# Patient Record
Sex: Female | Born: 1980 | Race: Black or African American | Hispanic: No | Marital: Single | Smoking: Never smoker
Health system: Southern US, Community
[De-identification: ages and names within clinical notes are randomized; demographics above are authoritative.]

## PROBLEM LIST (undated history)

## (undated) DIAGNOSIS — E039 Hypothyroidism, unspecified: Secondary | ICD-10-CM

## (undated) DIAGNOSIS — F32A Depression, unspecified: Secondary | ICD-10-CM

## (undated) DIAGNOSIS — F513 Sleepwalking [somnambulism]: Secondary | ICD-10-CM

## (undated) DIAGNOSIS — J302 Other seasonal allergic rhinitis: Secondary | ICD-10-CM

## (undated) DIAGNOSIS — K219 Gastro-esophageal reflux disease without esophagitis: Secondary | ICD-10-CM

## (undated) DIAGNOSIS — F329 Major depressive disorder, single episode, unspecified: Secondary | ICD-10-CM

## (undated) DIAGNOSIS — E119 Type 2 diabetes mellitus without complications: Secondary | ICD-10-CM

## (undated) DIAGNOSIS — G43909 Migraine, unspecified, not intractable, without status migrainosus: Secondary | ICD-10-CM

## (undated) DIAGNOSIS — T753XXA Motion sickness, initial encounter: Secondary | ICD-10-CM

## (undated) DIAGNOSIS — D259 Leiomyoma of uterus, unspecified: Secondary | ICD-10-CM

---

## 2005-09-20 ENCOUNTER — Other Ambulatory Visit: Admission: RE | Admit: 2005-09-20 | Discharge: 2005-09-20 | Payer: Self-pay | Admitting: Obstetrics and Gynecology

## 2006-04-02 ENCOUNTER — Emergency Department (HOSPITAL_COMMUNITY): Admission: EM | Admit: 2006-04-02 | Discharge: 2006-04-02 | Payer: Self-pay | Admitting: Emergency Medicine

## 2007-10-02 ENCOUNTER — Emergency Department (HOSPITAL_COMMUNITY): Admission: EM | Admit: 2007-10-02 | Discharge: 2007-10-02 | Payer: Self-pay | Admitting: Family Medicine

## 2007-11-01 ENCOUNTER — Encounter: Admission: RE | Admit: 2007-11-01 | Discharge: 2007-11-01 | Payer: Self-pay | Admitting: Internal Medicine

## 2009-07-02 IMAGING — US US ABDOMEN COMPLETE
1 series · 14 of 25 positions shown · non-contrast
Comparison: None.

CLINICAL DATA: GERD, vomiting. Goiter.
 ABDOMEN ULTRASOUND:
TECHNIQUE: Complete abdominal ultrasound examination was performed including evaluation of the liver, gallbladder, bile ducts, pancreas, kidneys, spleen, IVC, and abdominal aorta.

[Series 1: us abdomen complete · 0.26mm/px · 14 of 68 slices shown]
[im 1/68]
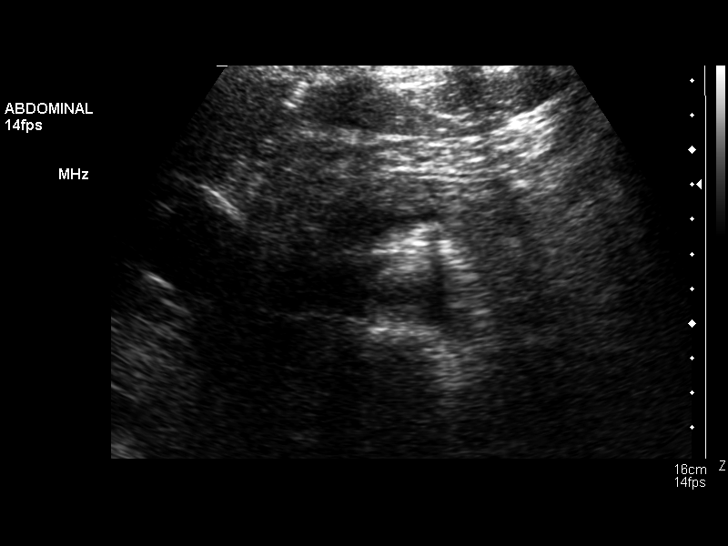
[im 6/68]
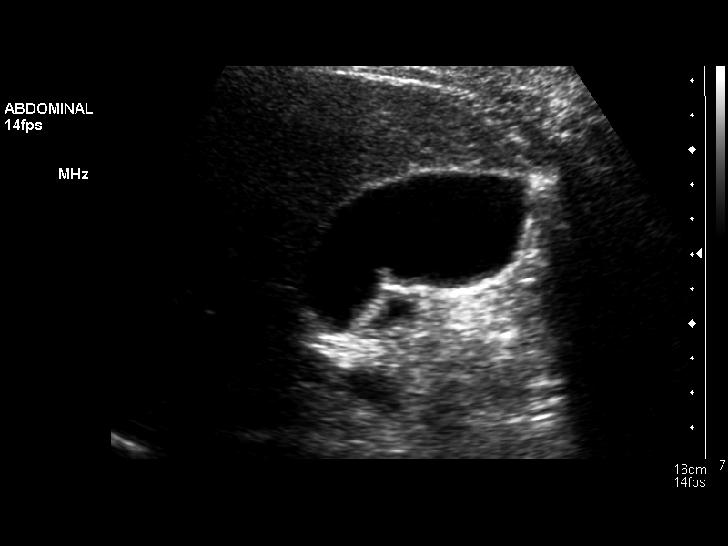
[im 12/68]
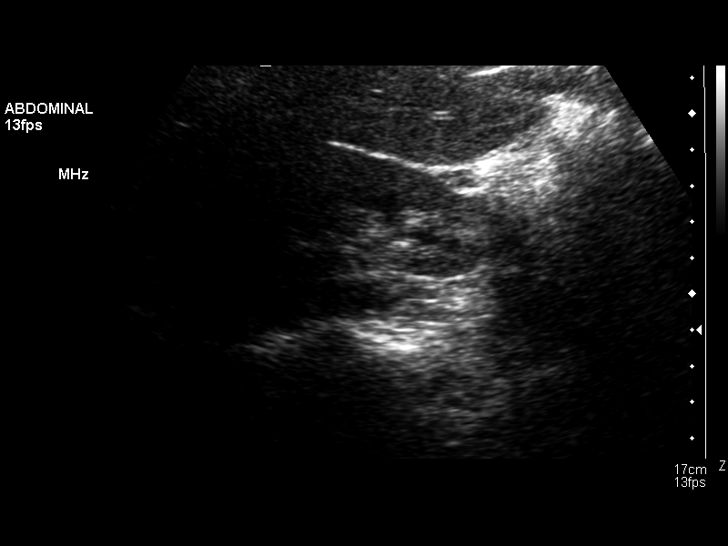
[im 17/68]
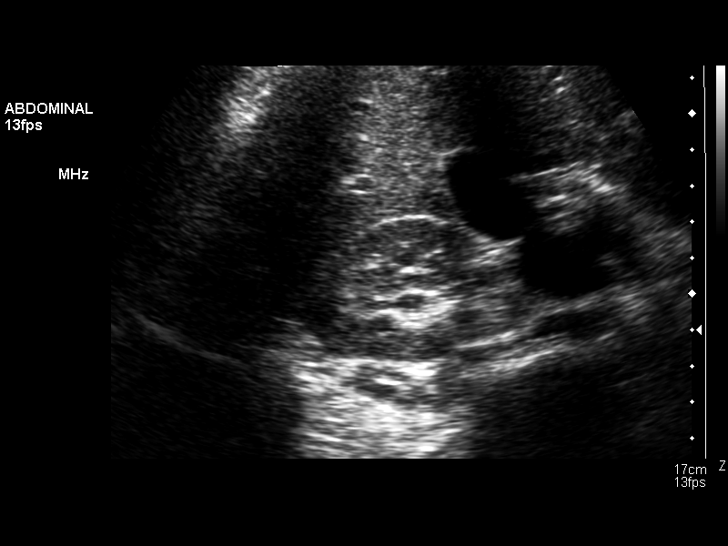
[im 23/68]
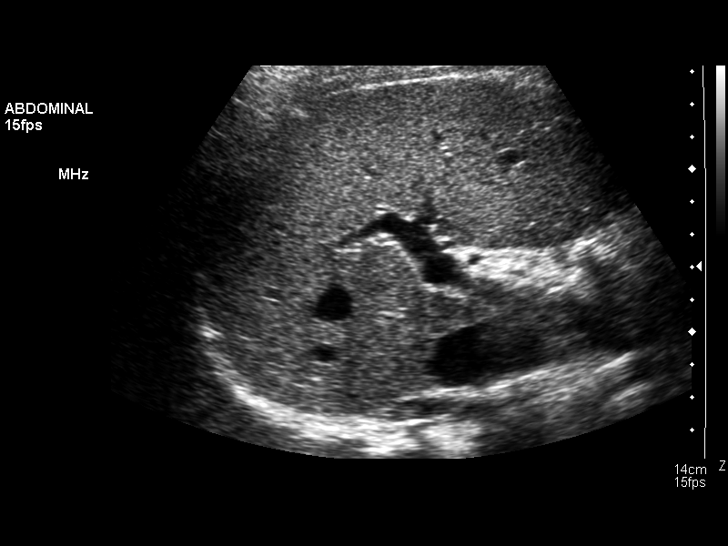
[im 26/68]
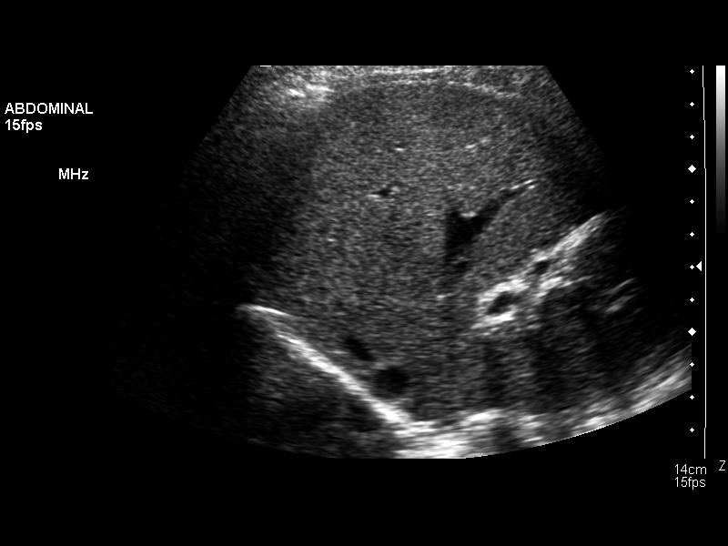
[im 31/68]
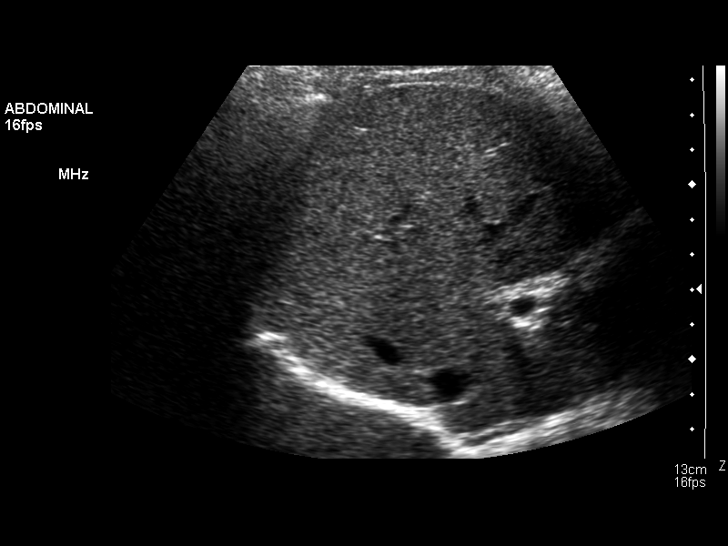
[im 37/68]
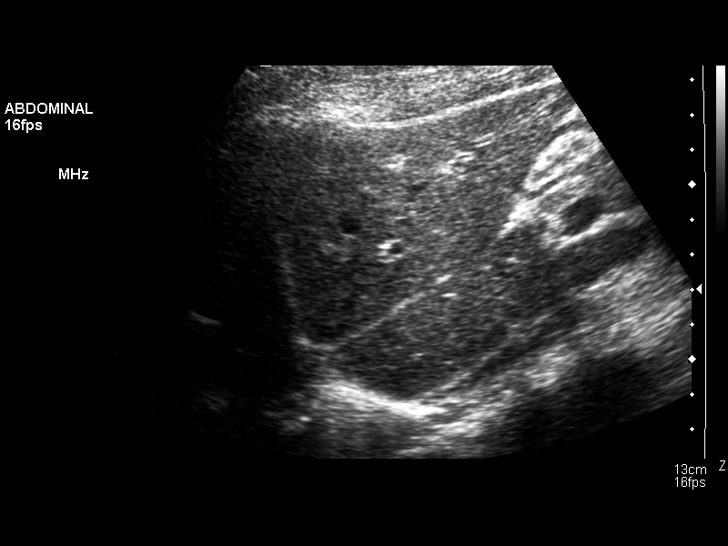
[im 42/68]
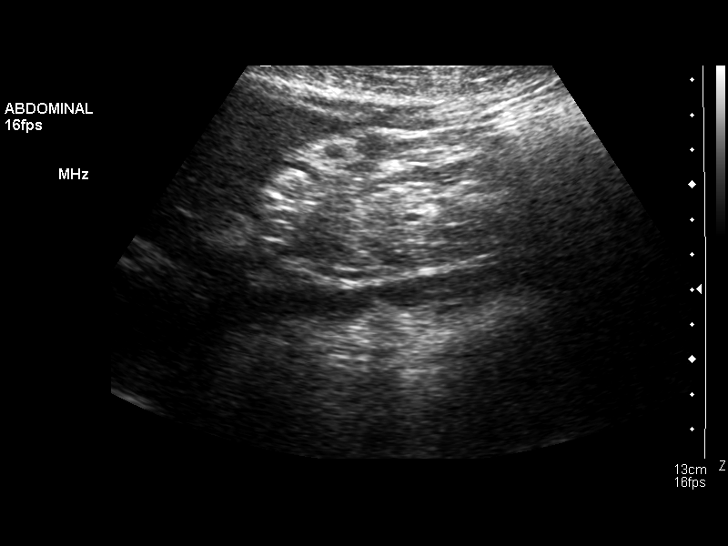
[im 45/68]
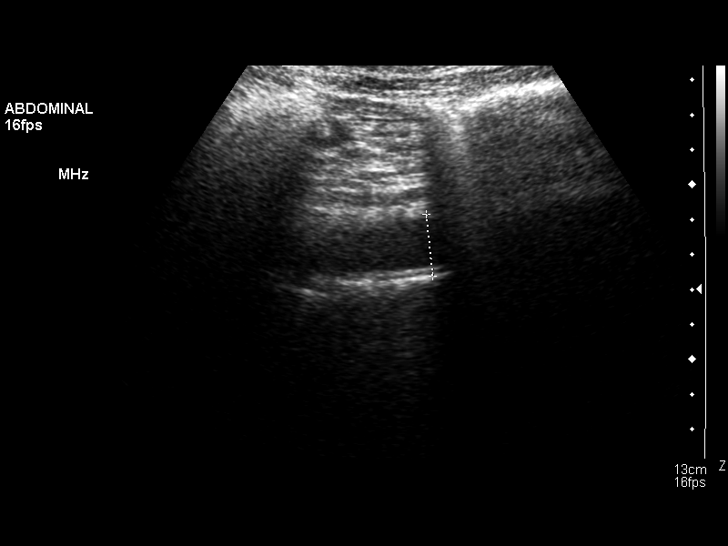
[im 51/68]
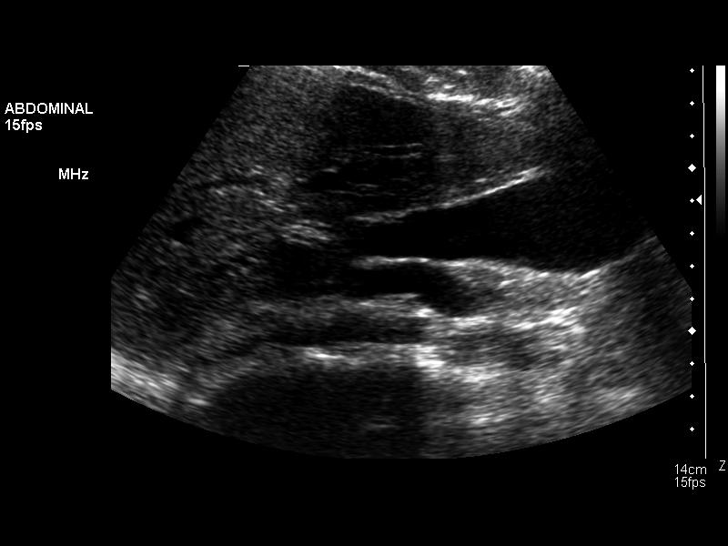
[im 56/68]
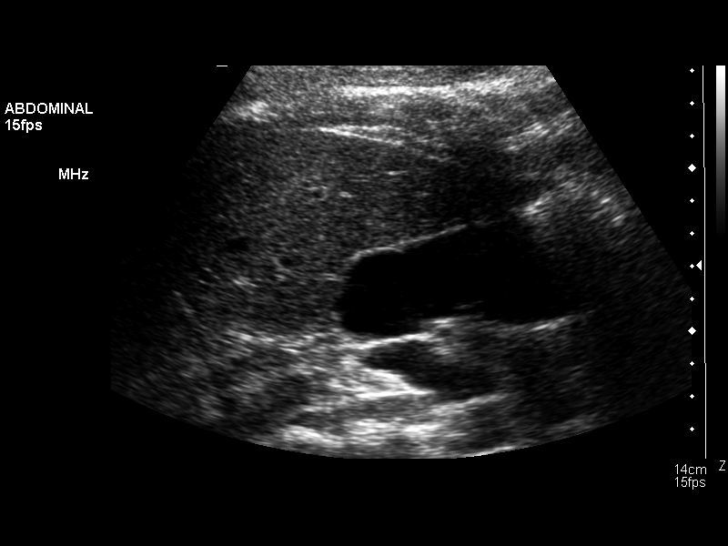
[im 62/68]
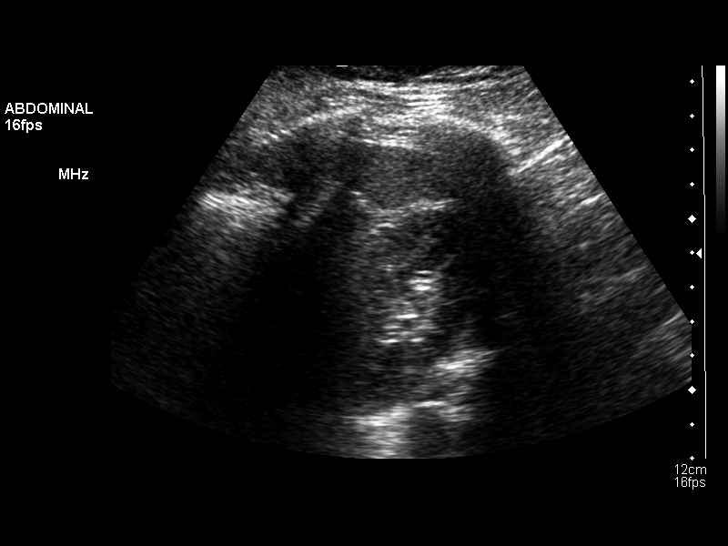
[im 68/68]
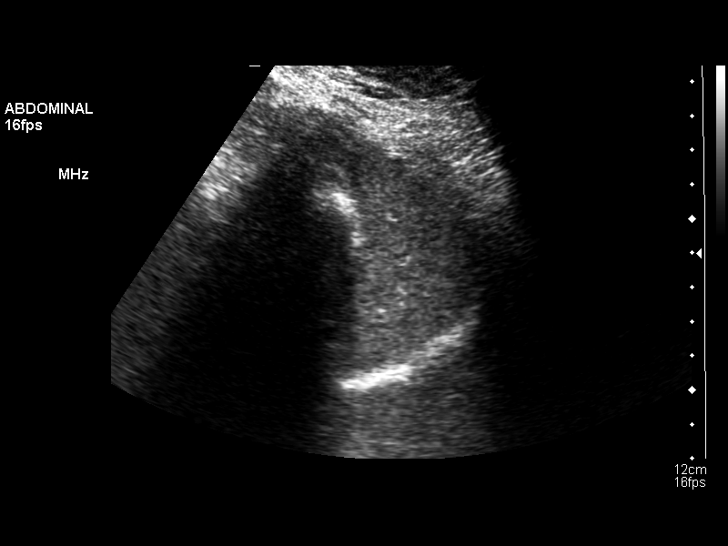

[14 of 25 positions shown; findings below may reference images not displayed]

FINDINGS: There is no evidence of gallstones or biliary ductal dilatation.  Gallbladder wall thickness is 3 mm.  Common bile duct diameter is 4 mm.  The liver is within normal limits in echogenicity, and no focal liver lesions are seen.  The visualized portions of the IVC and pancreas are unremarkable.  There is no evidence of splenomegaly with the spleen measuring 8.5 cm long.  The kidneys are unremarkable, and there is no evidence of hydronephrosis.  The right kidney measures 11.6 cm long with the left measuring 11 cm long.  The abdominal aorta is nondilated with maximum diameter of 2.2 cm.
IMPRESSION: Negative abdominal ultrasound.

## 2011-08-31 ENCOUNTER — Ambulatory Visit (HOSPITAL_BASED_OUTPATIENT_CLINIC_OR_DEPARTMENT_OTHER): Payer: BC Managed Care – PPO | Attending: Family Medicine

## 2011-08-31 DIAGNOSIS — G4730 Sleep apnea, unspecified: Secondary | ICD-10-CM | POA: Insufficient documentation

## 2011-08-31 DIAGNOSIS — G4710 Hypersomnia, unspecified: Secondary | ICD-10-CM | POA: Insufficient documentation

## 2011-08-31 DIAGNOSIS — R0609 Other forms of dyspnea: Secondary | ICD-10-CM | POA: Insufficient documentation

## 2011-08-31 DIAGNOSIS — R0989 Other specified symptoms and signs involving the circulatory and respiratory systems: Secondary | ICD-10-CM | POA: Insufficient documentation

## 2011-09-02 DIAGNOSIS — G4710 Hypersomnia, unspecified: Secondary | ICD-10-CM

## 2011-09-02 DIAGNOSIS — G4730 Sleep apnea, unspecified: Secondary | ICD-10-CM

## 2011-09-02 DIAGNOSIS — R0609 Other forms of dyspnea: Secondary | ICD-10-CM

## 2011-09-02 DIAGNOSIS — R0989 Other specified symptoms and signs involving the circulatory and respiratory systems: Secondary | ICD-10-CM

## 2011-09-02 NOTE — Procedures (Signed)
[name], Kathy Molina               ACCOUNT NO.:  [id number]  MEDICAL RECORD NO.:  [id number]          PATIENT TYPE:  OUT  LOCATION:  SLEEP CENTER                 FACILITY:  [facility]  PHYSICIAN:  Clinton D. Maple Hudson, MD, FCCP, FACPDATE OF BIRTH:  1980/04/04  DATE OF STUDY:  08/31/2011                           NOCTURNAL POLYSOMNOGRAM  REFERRING PHYSICIAN:  Paulino Rily  REFERRING DOCTOR:  Knox Royalty, MD  INDICATION FOR STUDY:  Hypersomnia with sleep apnea.  EPWORTH SLEEPINESS SCORE:  Epworth sleepiness score 14/24.  BMI 31.5, weight 207 pounds, height 68 inches, neck 15 inches.  MEDICATIONS:  Home medications are charted and reviewed.  SLEEP ARCHITECTURE:  Total sleep time 344 minutes with sleep efficiency 87.2%.  Stage I was 2.8%, stage II 74.9%, stage III absent, REM 22.4% of total sleep time.  Sleep latency 37 minutes, REM latency 129.5 minutes, awake after sleep onset 12.5 minutes, arousal index 15.5.  BEDTIME MEDICATION:  Ibuprofen.  RESPIRATORY DATA:  Apnea-hypopnea index (AHI) 2.6 per hour.  A total of 15 events were scored including 4 obstructive apneas, 3 central apneas, 8 hypopneas.  Most events were associated with supine sleep position. REM AHI 10.9 per hour.  There were insufficient numbers of events to qualify for application of CPAP titration by split protocol on this study night.  OXYGEN DATA:  Mild-to-moderate snoring with oxygen desaturation to a nadir of 86% and a mean oxygen saturation through the study of 96.4% on room air.  CARDIAC DATA:  Normal sinus rhythm.  MOVEMENT/PARASOMNIA:  A few limb jerks were noted with insignificant effect on sleep.  No sleep talking or sleep walking noted during the study, although reported from home.  Bathroom x2.  IMPRESSION/RECOMMENDATION: 1. Unremarkable sleep architecture for Sleep Center environment.  No     sleep talking or sleep walking noted during the study. 2. Occasional respiratory events with sleep  disturbance, within normal     limits.  Apnea/hypopnea index 2.6 per     hour (the normal range for adults is from 0-5 episodes per hour).     Mild-to-moderate snoring with oxygen desaturation to a nadir of 86%     and a mean oxygen saturation through the study of 96.4% on room     air.     Clinton D. Maple Hudson, MD, [facility], FACP Diplomate, Biomedical engineer of Sleep Medicine Electronically Signed    CDY/MEDQ  D:  09/02/2011 15:25:31  T:  09/02/2011 15:36:29  Job:  [id number]

## 2012-10-17 ENCOUNTER — Encounter (HOSPITAL_BASED_OUTPATIENT_CLINIC_OR_DEPARTMENT_OTHER): Payer: Self-pay | Admitting: *Deleted

## 2012-10-17 ENCOUNTER — Emergency Department (HOSPITAL_BASED_OUTPATIENT_CLINIC_OR_DEPARTMENT_OTHER)
Admission: EM | Admit: 2012-10-17 | Discharge: 2012-10-17 | Disposition: A | Discharge status: Home / Self Care | Payer: BC Managed Care – PPO | Attending: Emergency Medicine | Admitting: Emergency Medicine

## 2012-10-17 DIAGNOSIS — Z79899 Other long term (current) drug therapy: Secondary | ICD-10-CM | POA: Insufficient documentation

## 2012-10-17 DIAGNOSIS — F3289 Other specified depressive episodes: Secondary | ICD-10-CM | POA: Insufficient documentation

## 2012-10-17 DIAGNOSIS — F419 Anxiety disorder, unspecified: Secondary | ICD-10-CM

## 2012-10-17 DIAGNOSIS — F329 Major depressive disorder, single episode, unspecified: Secondary | ICD-10-CM

## 2012-10-17 DIAGNOSIS — G4700 Insomnia, unspecified: Secondary | ICD-10-CM

## 2012-10-17 DIAGNOSIS — Z87898 Personal history of other specified conditions: Secondary | ICD-10-CM | POA: Insufficient documentation

## 2012-10-17 DIAGNOSIS — F411 Generalized anxiety disorder: Secondary | ICD-10-CM | POA: Insufficient documentation

## 2012-10-17 DIAGNOSIS — Z8669 Personal history of other diseases of the nervous system and sense organs: Secondary | ICD-10-CM | POA: Insufficient documentation

## 2012-10-17 HISTORY — DX: Migraine, unspecified, not intractable, without status migrainosus: G43.909

## 2012-10-17 HISTORY — DX: Leiomyoma of uterus, unspecified: D25.9

## 2012-10-17 HISTORY — DX: Depression, unspecified: F32.A

## 2012-10-17 HISTORY — DX: Major depressive disorder, single episode, unspecified: F32.9

## 2012-10-17 MED ORDER — LORAZEPAM 1 MG PO TABS: 1.0000 mg | ORAL_TABLET | Freq: Three times a day (TID) | ORAL | Status: DC | PRN

## 2012-10-17 NOTE — ED Notes (Signed)
Patient states she has a long history of intermittent depression, normally is able to bring herself out of the depression.  States last Saturday 10/12/12 she woke up crying and has not been able to shake the depression, is having insomnia and decreased appetite.  States she has an appointment with the [facility] next week, but feels she can not wait till next week with no sleep or with the feelings of depression.  Denies being suicidal or homicidal or having a plan to do anything to her self or anyone.  States she has uncontrolled crying and has missed work this week and is sleeping last 3-4 hours per night.

## 2012-10-17 NOTE — ED Provider Notes (Signed)
History     CSN: [id number]  Arrival date & time 10/17/12  1322   First MD Initiated Contact with Patient 10/17/12 1329      Chief Complaint  Patient presents with  . Depression    (Consider location/radiation/quality/duration/timing/severity/associated sxs/prior treatment) HPI Comments: This is a 31 year old female, who presents to the ED with a chief complaint of depression and insomnia.  She states that her symptoms have been present for almost week.  She reports crying uncontrollably and only being able to sleep 3-4 hours per night.  She has a history of depression, for which she was taking Zoloft, but she discontinued this several years ago.  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  She also says that she recently broke up with her boyfriend, and that her parents are pressuring her to get married and start a family.  She has not taken any medications to alleviate her symptoms.  Her symptoms have been constant over the past week. She endorses insomnia and nausea when she eats.   Additionally, she reports having a migraine yesterday, but she denies any pain, headache, blurred vision, chest pain, SOB, nausea, vomiting, diarrhea, constipation, numbness or tingling of the extremities, or peripheral edema today.  The history is provided by the patient. No language interpreter was used.    Past Medical History  Diagnosis Date  . Depression   . Migraine   . Fibroid uterus     History reviewed. No pertinent past surgical history.  No family history on file.  History  Substance Use Topics  . Smoking status: Never Smoker   . Smokeless tobacco: Not on file  . Alcohol Use: Yes     Comment: occassionally    OB History    Grav Para Term Preterm Abortions TAB SAB Ect Mult Living                  Review of Systems  All other systems reviewed and are negative.    Allergies  Review of patient's  allergies indicates no known allergies.  Home Medications  No current outpatient prescriptions on file.  BP 142/92  Pulse 109  Temp 98.4 F (36.9 C) (Oral)  Resp 20  SpO2 100%  Physical Exam  Nursing note and vitals reviewed. Constitutional: She is oriented to person, place, and time. She appears well-developed and well-nourished.  HENT:  Head: Normocephalic and atraumatic.  Eyes: Conjunctivae normal and EOM are normal. Pupils are equal, round, and reactive to light.  Neck: Normal range of motion. Neck supple.  Cardiovascular: Normal rate and regular rhythm.  Exam reveals no gallop and no friction rub.   No murmur heard. Pulmonary/Chest: Effort normal and breath sounds normal. No respiratory distress. She has no wheezes. She has no rales. She exhibits no tenderness.  Abdominal: Soft. Bowel sounds are normal. She exhibits no distension and no mass. There is no tenderness. There is no rebound and no guarding.  Musculoskeletal: Normal range of motion. She exhibits no edema and no tenderness.  Neurological: She is alert and oriented to person, place, and time.  Skin: Skin is warm and dry.  Psychiatric: Her behavior is normal. Judgment and thought content normal.       Depressed mood.     ED Course  Procedures (including critical care time)  Labs Reviewed - No data to display No results found.   1. Depression   2. Insomnia  3. Anxiety       MDM  This is a 31 year old female with anxiety, depression, and insomnia.  Patient does not have any SI/HI.  Patient feels that if she could get something to help her relax and sleep, then she would be able to make it to her appointment [facility]. I am going to discharge the patient to home with Ativan, and will encourage her to f/u with the [facility], for which she already has an appointment.  I have discussed return precautions, including worsening symptoms and development of SI/HI.  I have encouraged her to  return to the [facility] ED if this becomes the case, as there would be easier access to behavioral health specialists.   The patient understands and agrees with the plan.  I have discussed this patient with Dr. Roselyn Bering, who agrees with my plan.        Roxy Horseman, PA-C 10/17/12 1416

## 2012-10-18 NOTE — ED Provider Notes (Signed)
Medical screening examination/treatment/procedure(s) were performed by non-physician practitioner and as supervising physician I was immediately available for consultation/collaboration.    Jakwan Sally R Esperanza Madrazo, MD 10/18/12 0714 

## 2014-09-03 ENCOUNTER — Ambulatory Visit (HOSPITAL_COMMUNITY): Admit: 2014-09-03 | Payer: BC Managed Care – PPO | Admitting: Obstetrics and Gynecology

## 2014-09-03 ENCOUNTER — Encounter (HOSPITAL_COMMUNITY): Payer: Self-pay

## 2014-09-03 SURGERY — ROBOTIC ASSISTED MYOMECTOMY
Anesthesia: General

## 2014-09-14 ENCOUNTER — Other Ambulatory Visit: Payer: Self-pay | Admitting: Obstetrics and Gynecology

## 2014-09-14 ENCOUNTER — Encounter (HOSPITAL_COMMUNITY): Payer: Self-pay | Admitting: Pharmacist

## 2014-09-17 NOTE — Patient Instructions (Addendum)
   Your procedure is scheduled on:  Tuesday, Nov 3  Enter through the [employer] [facility] at: 6 AM Pick up the phone at the desk and dial [phone] and inform us of your arrival.  Please call this number if you have any problems the morning of surgery: [phone]  Remember: Do not eat or drink after midnight: Monday Take these medicines the morning of surgery with a SIP OF WATER:  Lexapro, zantac  Do not wear jewelry, make-up, or FINGER nail polish No metal in your hair or on your body. Do not wear lotions, powders, perfumes.  You may wear deodorant.  Do not bring valuables to the hospital. Contacts, dentures or bridgework may not be worn into surgery.  Leave suitcase in the car. After Surgery it may be brought to your room. For patients being admitted to the hospital, checkout time is 11:00am the day of discharge.    Patients discharged on the day of surgery will not be allowed to drive home.

## 2014-09-18 ENCOUNTER — Inpatient Hospital Stay (HOSPITAL_COMMUNITY)
Admission: RE | Admit: 2014-09-18 | Discharge: 2014-09-18 | Disposition: A | Discharge status: Home / Self Care | Payer: BC Managed Care – PPO | Source: Ambulatory Visit

## 2014-09-22 ENCOUNTER — Ambulatory Visit (HOSPITAL_COMMUNITY)
Admission: RE | Admit: 2014-09-22 | Payer: BC Managed Care – PPO | Source: Ambulatory Visit | Admitting: Obstetrics and Gynecology

## 2014-09-22 ENCOUNTER — Encounter (HOSPITAL_COMMUNITY): Admission: RE | Payer: Self-pay | Source: Ambulatory Visit

## 2014-09-22 SURGERY — ROBOTIC ASSISTED MYOMECTOMY
Anesthesia: General

## 2015-11-05 ENCOUNTER — Ambulatory Visit (INDEPENDENT_AMBULATORY_CARE_PROVIDER_SITE_OTHER): Payer: Self-pay | Admitting: Obstetrics and Gynecology

## 2015-11-05 ENCOUNTER — Encounter: Payer: Self-pay | Admitting: Obstetrics and Gynecology

## 2015-11-05 VITALS — BP 134/58 | HR 108 | Temp 98.3°F | Ht 69.0 in | Wt 233.0 lb

## 2015-11-05 DIAGNOSIS — Z01812 Encounter for preprocedural laboratory examination: Secondary | ICD-10-CM

## 2015-11-05 DIAGNOSIS — D259 Leiomyoma of uterus, unspecified: Secondary | ICD-10-CM

## 2015-11-05 DIAGNOSIS — IMO0001 Reserved for inherently not codable concepts without codable children: Secondary | ICD-10-CM

## 2015-11-05 DIAGNOSIS — R030 Elevated blood-pressure reading, without diagnosis of hypertension: Secondary | ICD-10-CM

## 2015-11-05 DIAGNOSIS — Z3202 Encounter for pregnancy test, result negative: Secondary | ICD-10-CM

## 2015-11-05 LAB — POCT PREGNANCY, URINE: PREG TEST UR: NEGATIVE

## 2015-11-05 NOTE — Progress Notes (Signed)
Patient ID: Kathy Molina, female   DOB: 09-12-1981, 34 y.o.   MRN: [id number] 34 yo with known fibroid uterus presenting today for the evaluation of pelvic pain. Patient was scheduled for a robotic assisted myomectomy but the procedure was cancelled secondary to loss of insurance. Patient reports pelvic pain associated with her menstrual cycle. She reports a long history of menorrhagia where her cycles would last for 2 weeks. Her cycles were well controlled on NuvaRing and she had a normal 5-7 period for the past 3 years. The NuvaRing was discontinued due to loss of insurance and she was started on depo-provera. She recently received her second dose of depo and reports no vaginal bleeding. She is ready for surgical interventions. She doesn't have any children and desires to preserve her fertility.  Past Medical History  Diagnosis Date  . Depression   . Migraine   . Fibroid uterus    No past surgical history on file. No family history on file. Social History  Substance Use Topics  . Smoking status: Never Smoker   . Smokeless tobacco: Not on file  . Alcohol Use: Yes     Comment: occassionally   No family history on file.  ROS See pertinent in HPI  Blood pressure 134/58, pulse 108, temperature 98.3 F (36.8 C), height 5\' 9"  (1.753 m), weight 233 lb (105.688 kg), last menstrual period 09/24/2015.  GENERAL: Well-developed, well-nourished female in no acute distress. Obese HEENT: Normocephalic, atraumatic. Sclerae anicteric.  NECK: Supple. Normal thyroid.  LUNGS: Clear to auscultation bilaterally.  HEART: Regular rate and rhythm. BREASTS: Symmetric in size. No palpable masses or lymphadenopathy, skin changes, or nipple drainage. ABDOMEN: Soft, nontender, nondistended. No organomegaly. PELVIC: Normal external female genitalia. Vagina is pink and rugated.  Normal discharge. Normal appearing cervix. Uterus is 20-weeks in size. No adnexal mass or tenderness. EXTREMITIES: No cyanosis, clubbing,  or edema, 2+ distal pulses.  A/P 34 yo with symptomatic fibroid uterus - Pelvic ultrasound ordered - Patient reports normal pap smear last year - patient is ready for previously planned myomectomy. Risks, benefits were reviewed with the patient including but not limited to risks of bleeding, infection and damage to adjacent organs. Patient verbalized understanding and all questions were answered - patient will be contacted with surgical date and time

## 2015-11-10 ENCOUNTER — Encounter (HOSPITAL_COMMUNITY): Payer: Self-pay | Admitting: *Deleted

## 2015-11-11 ENCOUNTER — Ambulatory Visit (HOSPITAL_COMMUNITY): Payer: Self-pay

## 2015-11-12 ENCOUNTER — Ambulatory Visit (HOSPITAL_COMMUNITY)
Admission: RE | Admit: 2015-11-12 | Discharge: 2015-11-12 | Disposition: A | Discharge status: Home / Self Care | Payer: Self-pay | Source: Ambulatory Visit | Attending: Obstetrics and Gynecology | Admitting: Obstetrics and Gynecology

## 2015-11-12 DIAGNOSIS — D259 Leiomyoma of uterus, unspecified: Secondary | ICD-10-CM | POA: Insufficient documentation

## 2015-11-19 NOTE — Patient Instructions (Addendum)
Your procedure is scheduled on:  Tuesday, Jan. 10, 2017  Enter through the Main Entrance of [facility] at:  11:30 A.M.  Pick up the phone at the desk and dial [phone].  Call this number if you have problems the morning of surgery: [phone].  Remember: Do NOT eat food:  After Midnight Monday, Jan. 9, 2017 Do NOT drink clear liquids after:  9:00 AM. Day of surgery Take these medicines the morning of surgery with a SIP OF WATER:  Zantac, Zyrtec  Do NOT wear jewelry (body piercing), metal hair clips/bobby pins, make-up, or nail polish. Do NOT wear lotions, powders, or perfumes.  You may wear deoderant. Do NOT shave for 48 hours prior to surgery. Do NOT bring valuables to the hospital. Contacts, dentures, or bridgework may not be worn into surgery. Leave suitcase in car.  After surgery it may be brought to your room.  For patients admitted to the hospital, checkout time is 11:00 AM the day of discharge.

## 2015-11-23 ENCOUNTER — Encounter (HOSPITAL_COMMUNITY): Payer: Self-pay

## 2015-11-23 ENCOUNTER — Encounter (HOSPITAL_COMMUNITY)
Admission: RE | Admit: 2015-11-23 | Discharge: 2015-11-23 | Disposition: A | Discharge status: Home / Self Care | Payer: BLUE CROSS/BLUE SHIELD | Source: Ambulatory Visit | Attending: Obstetrics and Gynecology | Admitting: Obstetrics and Gynecology

## 2015-11-23 DIAGNOSIS — Z01818 Encounter for other preprocedural examination: Secondary | ICD-10-CM | POA: Insufficient documentation

## 2015-11-23 DIAGNOSIS — D259 Leiomyoma of uterus, unspecified: Secondary | ICD-10-CM | POA: Diagnosis not present

## 2015-11-23 HISTORY — DX: Gastro-esophageal reflux disease without esophagitis: K21.9

## 2015-11-23 HISTORY — DX: Other seasonal allergic rhinitis: J30.2

## 2015-11-23 HISTORY — DX: Type 2 diabetes mellitus without complications: E11.9

## 2015-11-23 HISTORY — DX: Hypothyroidism, unspecified: E03.9

## 2015-11-23 HISTORY — DX: Sleepwalking (somnambulism): F51.3

## 2015-11-23 HISTORY — DX: Motion sickness, initial encounter: T75.3XXA

## 2015-11-23 LAB — CBC
HCT: 40.4 % (ref 36.0–46.0)
HEMOGLOBIN: 13.6 g/dL (ref 12.0–15.0)
MCH: 27.6 pg (ref 26.0–34.0)
MCHC: 33.7 g/dL (ref 30.0–36.0)
MCV: 81.9 fL (ref 78.0–100.0)
Platelets: 286 10*3/uL (ref 150–400)
RBC: 4.93 MIL/uL (ref 3.87–5.11)
RDW: 14.4 % (ref 11.5–15.5)
WBC: 6.7 10*3/uL (ref 4.0–10.5)

## 2015-11-23 LAB — TYPE AND SCREEN
ABO/RH(D): A POS
Antibody Screen: NEGATIVE

## 2015-11-23 LAB — ABO/RH: ABO/RH(D): A POS

## 2015-11-29 NOTE — H&P (Signed)
Kathy Molina is an 35 y.o. female G52 presenting today for scheduled myomectomy. Patient has a long standing history of fibroid uterus (at least 5 years) with associated menorrhagia and pelvic pain. Her menorrhagia is now well controlled with depo-provera but her pelvic pain remains. She desires to preserve her fertility. She is without any other complaints  Pertinent Gynecological History: Menses: amenorrhea due to depo-provera Contraception: Depo-Provera injections DES exposure: denies Sexually transmitted diseases: no past history Previous GYN Procedures: none  Last mammogram: n/a  Last pap: normal Date: 2015 as reported by the patient OB History: G0, P0   Menstrual History: Patient's last menstrual period was 09/24/2015.    Past Medical History  Diagnosis Date  . Migraine   . Fibroid uterus   . Depression   . Seasonal allergies   . GERD (gastroesophageal reflux disease)   . Sleep walking   . Hypothyroidism     was told she had low thyroid level, but no follow up since  . Diabetes mellitus without complication (HCC)     borderline  . Motion sickness     History reviewed. No pertinent past surgical history.  History reviewed. No pertinent family history.  Social History:  reports that she has never smoked. She has never used smokeless tobacco. She reports that she drinks alcohol. She reports that she does not use illicit drugs.  Allergies:  Allergies  Allergen Reactions  . Augmentin [Amoxicillin-Pot Clavulanate] Itching  . Other Other (See Comments)    Pollen- headaches  . Fish Oil Rash  . Shellfish Allergy Itching and Rash    Prescriptions prior to admission  Medication Sig Dispense Refill Last Dose  . cetirizine (ZYRTEC) 10 MG tablet Take 10 mg by mouth daily.   11/30/2015 at 0800  . diphenhydrAMINE (SOMINEX) 25 MG tablet Take 25 mg by mouth as needed for allergies.   Past Week at Unknown time  . EPINEPHrine (EPIPEN 2-PAK) 0.3 mg/0.3 mL IJ SOAJ injection Inject  0.3 mg into the muscle as needed.   Taking  . ibuprofen (ADVIL,MOTRIN) 800 MG tablet Take 800 mg by mouth every 6 (six) hours as needed for moderate pain.    Past Week at Unknown time  . MedroxyPROGESTERone Acetate (DEPO-PROVERA IM) Inject 1 Dose into the muscle every 3 (three) months.     . promethazine (PHENERGAN) 25 MG tablet Take 25 mg by mouth every 6 (six) hours as needed for nausea or vomiting.   Past Month at Unknown time  . ranitidine (ZANTAC) 150 MG tablet Take 150 mg by mouth 2 (two) times daily.   11/29/2015 at 2200    ROS See pertinent in HPI  Blood pressure 128/88, pulse 113, temperature 97.9 F (36.6 C), temperature source Oral, resp. rate 20, last menstrual period 09/24/2015, SpO2 100 %. Physical Exam GENERAL: Well-developed, well-nourished female in no acute distress.  HEENT: Normocephalic, atraumatic. Sclerae anicteric.  NECK: Supple. Normal thyroid.  LUNGS: Clear to auscultation bilaterally.  HEART: Regular rate and rhythm. ABDOMEN: Soft, nontender, nondistended. No organomegaly. PELVIC: Normal external female genitalia. Vagina is pink and rugated.  Normal discharge. Normal appearing cervix. Uterus is palpable at the level of the umbilicus. No adnexal mass or tenderness. EXTREMITIES: No cyanosis, clubbing, or edema, 2+ distal pulses.  Results for orders placed or performed during the hospital encounter of 11/30/15 (from the past 24 hour(s))  Pregnancy, urine     Status: None   Collection Time: 11/30/15 11:29 AM  Result Value Ref Range   Preg Test, Ur  NEGATIVE NEGATIVE    No results found. Ultrasound 11/12/2015 FINDINGS: Uterus  Measurements: 7.9 x 3.9 x 5.9 cm. There is a dominant superior fundal fibroid measuring 11 x 10 x 10.9 cm. There is a posterior fibroid in the mid uterine corpus measuring 2.2 cm in greatest dimension. There are 3 fibroids in the left aspect of the uterine corpus. These measure up to 2.1 cm in diameter.  Endometrium  Thickness: 7.7  mm. No focal abnormality visualized.  Right ovary  Measurements: 3.2 x 2.2 x 2.2 cm. There is an echogenic focus within the substance of the right ovary which measures 1.5 x 1.4 x 1.4 cm. There is a small amount of distal shadowing.  Left ovary  Measurements: 2.5 x 1.6 x 1.6 cm. Normal appearance/no adnexal mass.  Other findings  There is no free pelvic fluid.  IMPRESSION: 1. Very large superior uterine fundal fibroid measuring nearly 11 cm in overall diameter. Multiple smaller fibroids measuring up to 2.2 cm in greatest dimension. The endometrial stripe is normal. 2. There is an echogenic focus in the right ovary without significant increased vascularity that suggests a dermoid. 3. The left ovary is unremarkable.  Assessment/Plan: 35 yo G0 with symptomatic fibroid uterus here for surgical intervention with myomectomy - Risks, benefits and alternatives were reviewed and explained to the patient including but not limited to risks of bleeding, infection and damage to adjacent organs. Patient verbalized understanding and all questions were answered.  Kathy Molina 11/30/2015, 12:35 PM

## 2015-11-30 ENCOUNTER — Encounter (HOSPITAL_COMMUNITY): Payer: Self-pay | Admitting: Anesthesiology

## 2015-11-30 ENCOUNTER — Ambulatory Visit (HOSPITAL_COMMUNITY): Payer: BLUE CROSS/BLUE SHIELD | Admitting: Anesthesiology

## 2015-11-30 ENCOUNTER — Inpatient Hospital Stay (HOSPITAL_COMMUNITY)
Admission: AD | Admit: 2015-11-30 | Discharge: 2015-12-02 | DRG: 743 | Disposition: A | Discharge status: Home / Self Care | Payer: BLUE CROSS/BLUE SHIELD | Source: Ambulatory Visit | Attending: Obstetrics and Gynecology | Admitting: Obstetrics and Gynecology

## 2015-11-30 ENCOUNTER — Encounter (HOSPITAL_COMMUNITY)
Admission: AD | Disposition: A | Discharge status: Home / Self Care | Payer: Self-pay | Source: Ambulatory Visit | Attending: Obstetrics and Gynecology

## 2015-11-30 DIAGNOSIS — E119 Type 2 diabetes mellitus without complications: Secondary | ICD-10-CM | POA: Diagnosis not present

## 2015-11-30 DIAGNOSIS — Z91018 Allergy to other foods: Secondary | ICD-10-CM

## 2015-11-30 DIAGNOSIS — K219 Gastro-esophageal reflux disease without esophagitis: Secondary | ICD-10-CM | POA: Diagnosis present

## 2015-11-30 DIAGNOSIS — D259 Leiomyoma of uterus, unspecified: Principal | ICD-10-CM | POA: Diagnosis present

## 2015-11-30 DIAGNOSIS — Z91013 Allergy to seafood: Secondary | ICD-10-CM

## 2015-11-30 DIAGNOSIS — Z881 Allergy status to other antibiotic agents status: Secondary | ICD-10-CM

## 2015-11-30 DIAGNOSIS — N920 Excessive and frequent menstruation with regular cycle: Secondary | ICD-10-CM | POA: Diagnosis present

## 2015-11-30 DIAGNOSIS — Z9889 Other specified postprocedural states: Secondary | ICD-10-CM

## 2015-11-30 HISTORY — PX: MYOMECTOMY: SHX85

## 2015-11-30 LAB — PREGNANCY, URINE: Preg Test, Ur: NEGATIVE

## 2015-11-30 LAB — GLUCOSE, CAPILLARY: GLUCOSE-CAPILLARY: 126 mg/dL — AB (ref 65–99)

## 2015-11-30 SURGERY — MYOMECTOMY, ABDOMINAL APPROACH
Anesthesia: General | Site: Abdomen

## 2015-11-30 MED ORDER — FLUMAZENIL 0.5 MG/5ML IV SOLN
INTRAVENOUS | Status: DC | PRN
  Administered 2015-11-30: 0.2 mg via INTRAVENOUS

## 2015-11-30 MED ORDER — DEXAMETHASONE SODIUM PHOSPHATE 10 MG/ML IJ SOLN
INTRAMUSCULAR | Status: DC | PRN
  Administered 2015-11-30: 4 mg via INTRAVENOUS

## 2015-11-30 MED ORDER — FENTANYL CITRATE (PF) 250 MCG/5ML IJ SOLN
INTRAMUSCULAR | Status: AC
  Filled 2015-11-30: qty 5

## 2015-11-30 MED ORDER — LACTATED RINGERS IV SOLN
INTRAVENOUS | Status: DC
  Administered 2015-11-30: 12:00:00 via INTRAVENOUS
  Administered 2015-11-30: 125 mL/h via INTRAVENOUS
  Administered 2015-11-30: 13:00:00 via INTRAVENOUS

## 2015-11-30 MED ORDER — FENTANYL CITRATE (PF) 100 MCG/2ML IJ SOLN
INTRAMUSCULAR | Status: DC | PRN
  Administered 2015-11-30: 25 ug via INTRAVENOUS
  Administered 2015-11-30 (×2): 50 ug via INTRAVENOUS
  Administered 2015-11-30: 100 ug via INTRAVENOUS
  Administered 2015-11-30: 50 ug via INTRAVENOUS
  Administered 2015-11-30: 25 ug via INTRAVENOUS
  Administered 2015-11-30: 50 ug via INTRAVENOUS

## 2015-11-30 MED ORDER — NALOXONE HCL 0.4 MG/ML IJ SOLN: 0.4000 mg | INTRAMUSCULAR | Status: DC | PRN

## 2015-11-30 MED ORDER — BUPIVACAINE HCL (PF) 0.5 % IJ SOLN
INTRAMUSCULAR | Status: AC
  Filled 2015-11-30: qty 30

## 2015-11-30 MED ORDER — DEXAMETHASONE SODIUM PHOSPHATE 4 MG/ML IJ SOLN
INTRAMUSCULAR | Status: AC
  Filled 2015-11-30: qty 1

## 2015-11-30 MED ORDER — BUPIVACAINE HCL (PF) 0.5 % IJ SOLN
INTRAMUSCULAR | Status: DC | PRN
  Administered 2015-11-30: 20 mL

## 2015-11-30 MED ORDER — KETOROLAC TROMETHAMINE 30 MG/ML IJ SOLN
INTRAMUSCULAR | Status: AC
  Filled 2015-11-30: qty 1

## 2015-11-30 MED ORDER — SODIUM CHLORIDE 0.9 % IJ SOLN
INTRAMUSCULAR | Status: AC
  Filled 2015-11-30: qty 100

## 2015-11-30 MED ORDER — CEFAZOLIN SODIUM-DEXTROSE 2-3 GM-% IV SOLR
INTRAVENOUS | Status: AC
  Filled 2015-11-30: qty 50

## 2015-11-30 MED ORDER — LIDOCAINE HCL (CARDIAC) 20 MG/ML IV SOLN
INTRAVENOUS | Status: AC
  Filled 2015-11-30: qty 5

## 2015-11-30 MED ORDER — VASOPRESSIN 20 UNIT/ML IV SOLN
INTRAVENOUS | Status: AC
  Filled 2015-11-30: qty 1

## 2015-11-30 MED ORDER — CEFAZOLIN SODIUM-DEXTROSE 2-3 GM-% IV SOLR
2.0000 g | INTRAVENOUS | Status: AC
  Administered 2015-11-30: 2 g via INTRAVENOUS

## 2015-11-30 MED ORDER — DIPHENHYDRAMINE HCL 12.5 MG/5ML PO ELIX: 12.5000 mg | ORAL_SOLUTION | Freq: Four times a day (QID) | ORAL | Status: DC | PRN

## 2015-11-30 MED ORDER — MIDAZOLAM HCL 2 MG/2ML IJ SOLN
INTRAMUSCULAR | Status: DC | PRN
  Administered 2015-11-30: 2 mg via INTRAVENOUS

## 2015-11-30 MED ORDER — EPHEDRINE SULFATE 50 MG/ML IJ SOLN
INTRAMUSCULAR | Status: DC | PRN
  Administered 2015-11-30: 10 mg via INTRAVENOUS

## 2015-11-30 MED ORDER — NEOSTIGMINE METHYLSULFATE 10 MG/10ML IV SOLN
INTRAVENOUS | Status: AC
  Filled 2015-11-30: qty 1

## 2015-11-30 MED ORDER — SCOPOLAMINE 1 MG/3DAYS TD PT72
MEDICATED_PATCH | TRANSDERMAL | Status: AC
  Administered 2015-11-30: 1.5 mg via TRANSDERMAL
  Filled 2015-11-30: qty 1

## 2015-11-30 MED ORDER — PROMETHAZINE HCL 25 MG/ML IJ SOLN
6.2500 mg | INTRAMUSCULAR | Status: DC | PRN
  Administered 2015-11-30: 12.5 mg via INTRAVENOUS

## 2015-11-30 MED ORDER — GLYCOPYRROLATE 0.2 MG/ML IJ SOLN
INTRAMUSCULAR | Status: AC
  Filled 2015-11-30: qty 3

## 2015-11-30 MED ORDER — HYDROMORPHONE HCL 1 MG/ML IJ SOLN
INTRAMUSCULAR | Status: AC
  Administered 2015-11-30: 0.5 mg via INTRAVENOUS
  Filled 2015-11-30: qty 1

## 2015-11-30 MED ORDER — ONDANSETRON HCL 4 MG/2ML IJ SOLN: 4.0000 mg | Freq: Four times a day (QID) | INTRAMUSCULAR | Status: DC | PRN

## 2015-11-30 MED ORDER — MIDAZOLAM HCL 2 MG/2ML IJ SOLN
INTRAMUSCULAR | Status: AC
  Filled 2015-11-30: qty 2

## 2015-11-30 MED ORDER — ONDANSETRON HCL 4 MG/2ML IJ SOLN
INTRAMUSCULAR | Status: AC
  Filled 2015-11-30: qty 2

## 2015-11-30 MED ORDER — HYDROMORPHONE 1 MG/ML IV SOLN
INTRAVENOUS | Status: DC
  Administered 2015-11-30: 17:00:00 via INTRAVENOUS
  Administered 2015-11-30: 3.6 mg via INTRAVENOUS
  Administered 2015-12-01: 1.8 mg via INTRAVENOUS
  Administered 2015-12-01: 1.5 mg via INTRAVENOUS
  Administered 2015-12-01: 06:00:00 via INTRAVENOUS
  Filled 2015-11-30 (×2): qty 25

## 2015-11-30 MED ORDER — SODIUM CHLORIDE 0.9 % IJ SOLN: 9.0000 mL | INTRAMUSCULAR | Status: DC | PRN

## 2015-11-30 MED ORDER — NEOSTIGMINE METHYLSULFATE 10 MG/10ML IV SOLN
INTRAVENOUS | Status: DC | PRN
  Administered 2015-11-30: 4 mg via INTRAVENOUS

## 2015-11-30 MED ORDER — ROCURONIUM BROMIDE 100 MG/10ML IV SOLN
INTRAVENOUS | Status: DC | PRN
  Administered 2015-11-30: 10 mg via INTRAVENOUS
  Administered 2015-11-30: 50 mg via INTRAVENOUS

## 2015-11-30 MED ORDER — ROCURONIUM BROMIDE 100 MG/10ML IV SOLN
INTRAVENOUS | Status: AC
  Filled 2015-11-30: qty 1

## 2015-11-30 MED ORDER — PROPOFOL 10 MG/ML IV BOLUS
INTRAVENOUS | Status: DC | PRN
  Administered 2015-11-30: 200 mg via INTRAVENOUS

## 2015-11-30 MED ORDER — SCOPOLAMINE 1 MG/3DAYS TD PT72
1.0000 | MEDICATED_PATCH | Freq: Once | TRANSDERMAL | Status: DC
  Administered 2015-11-30: 1.5 mg via TRANSDERMAL

## 2015-11-30 MED ORDER — VASOPRESSIN 20 UNIT/ML IV SOLN
INTRAVENOUS | Status: DC | PRN
  Administered 2015-11-30: 36 mL via INTRAMUSCULAR

## 2015-11-30 MED ORDER — ONDANSETRON HCL 4 MG/2ML IJ SOLN
INTRAMUSCULAR | Status: DC | PRN
  Administered 2015-11-30: 4 mg via INTRAVENOUS

## 2015-11-30 MED ORDER — GLYCOPYRROLATE 0.2 MG/ML IJ SOLN
INTRAMUSCULAR | Status: DC | PRN
  Administered 2015-11-30: 0.6 mg via INTRAVENOUS

## 2015-11-30 MED ORDER — LORATADINE 10 MG PO TABS
10.0000 mg | ORAL_TABLET | Freq: Every day | ORAL | Status: DC
  Administered 2015-12-01: 10 mg via ORAL
  Filled 2015-11-30 (×3): qty 1

## 2015-11-30 MED ORDER — HYDROMORPHONE HCL 1 MG/ML IJ SOLN
0.2500 mg | INTRAMUSCULAR | Status: DC | PRN
  Administered 2015-11-30 (×4): 0.5 mg via INTRAVENOUS

## 2015-11-30 MED ORDER — DIPHENHYDRAMINE HCL 50 MG/ML IJ SOLN: 12.5000 mg | Freq: Four times a day (QID) | INTRAMUSCULAR | Status: DC | PRN

## 2015-11-30 MED ORDER — FLUMAZENIL 0.5 MG/5ML IV SOLN
INTRAVENOUS | Status: AC
  Filled 2015-11-30: qty 5

## 2015-11-30 MED ORDER — LIDOCAINE HCL (CARDIAC) 20 MG/ML IV SOLN
INTRAVENOUS | Status: DC | PRN
  Administered 2015-11-30: 70 mg via INTRAVENOUS
  Administered 2015-11-30: 30 mg via INTRAVENOUS

## 2015-11-30 MED ORDER — LACTATED RINGERS IV SOLN
INTRAVENOUS | Status: DC
  Administered 2015-11-30 (×2): via INTRAVENOUS
  Administered 2015-12-01: 100 mL/h via INTRAVENOUS

## 2015-11-30 MED ORDER — EPHEDRINE 5 MG/ML INJ
INTRAVENOUS | Status: AC
  Filled 2015-11-30: qty 10

## 2015-11-30 MED ORDER — PROMETHAZINE HCL 25 MG/ML IJ SOLN
INTRAMUSCULAR | Status: AC
  Administered 2015-11-30: 12.5 mg via INTRAVENOUS
  Filled 2015-11-30: qty 1

## 2015-11-30 MED ORDER — PROPOFOL 10 MG/ML IV BOLUS
INTRAVENOUS | Status: AC
  Filled 2015-11-30: qty 20

## 2015-11-30 SURGICAL SUPPLY — 41 items
BARRIER ADHS 3X4 INTERCEED (GAUZE/BANDAGES/DRESSINGS) ×6 IMPLANT
CANISTER SUCT 3000ML (MISCELLANEOUS) ×3 IMPLANT
CHLORAPREP W/TINT 26ML (MISCELLANEOUS) ×3 IMPLANT
CONT PATH 16OZ SNAP LID 3702 (MISCELLANEOUS) IMPLANT
CONT SPEC PATH 64OZ SNAP LID (MISCELLANEOUS) ×3 IMPLANT
DECANTER SPIKE VIAL GLASS SM (MISCELLANEOUS) ×3 IMPLANT
DRSG OPSITE POSTOP 4X10 (GAUZE/BANDAGES/DRESSINGS) ×3 IMPLANT
GAUZE SPONGE 4X4 16PLY XRAY LF (GAUZE/BANDAGES/DRESSINGS) ×3 IMPLANT
GLOVE BIOGEL PI IND STRL 6.5 (GLOVE) ×1 IMPLANT
GLOVE BIOGEL PI IND STRL 7.0 (GLOVE) ×4 IMPLANT
GLOVE BIOGEL PI INDICATOR 6.5 (GLOVE) ×2
GLOVE BIOGEL PI INDICATOR 7.0 (GLOVE) ×8
GLOVE SURG SS PI 6.0 STRL IVOR (GLOVE) ×3 IMPLANT
GOWN STRL REUS W/TWL LRG LVL3 (GOWN DISPOSABLE) ×9 IMPLANT
NDL SAFETY ECLIPSE 18X1.5 (NEEDLE) ×1 IMPLANT
NEEDLE HYPO 18GX1.5 SHARP (NEEDLE) ×2
NEEDLE HYPO 22GX1.5 SAFETY (NEEDLE) ×3 IMPLANT
NS IRRIG 1000ML POUR BTL (IV SOLUTION) ×3 IMPLANT
PACK ABDOMINAL GYN (CUSTOM PROCEDURE TRAY) ×3 IMPLANT
PAD ABD 7.5X8 STRL (GAUZE/BANDAGES/DRESSINGS) ×3 IMPLANT
PAD OB MATERNITY 4.3X12.25 (PERSONAL CARE ITEMS) ×3 IMPLANT
PENCIL SMOKE EVAC W/HOLSTER (ELECTROSURGICAL) IMPLANT
SPONGE GAUZE 4X4 12PLY STER LF (GAUZE/BANDAGES/DRESSINGS) ×3 IMPLANT
SPONGE LAP 18X18 X RAY DECT (DISPOSABLE) ×6 IMPLANT
STAPLER VISISTAT 35W (STAPLE) ×3 IMPLANT
SUT PLAIN 2 0 XLH (SUTURE) ×3 IMPLANT
SUT VIC AB 0 CT1 18XCR BRD8 (SUTURE) ×1 IMPLANT
SUT VIC AB 0 CT1 27 (SUTURE) ×6
SUT VIC AB 0 CT1 27XBRD ANBCTR (SUTURE) ×3 IMPLANT
SUT VIC AB 0 CT1 36 (SUTURE) ×21 IMPLANT
SUT VIC AB 0 CT1 8-18 (SUTURE) ×2
SUT VIC AB 2-0 CT1 27 (SUTURE) ×2
SUT VIC AB 2-0 CT1 TAPERPNT 27 (SUTURE) ×1 IMPLANT
SUT VIC AB 4-0 KS 27 (SUTURE) ×3 IMPLANT
SUT VIC AB 4-0 SH 27 (SUTURE)
SUT VIC AB 4-0 SH 27XANBCTRL (SUTURE) IMPLANT
SYR CONTROL 10ML LL (SYRINGE) ×6 IMPLANT
TAPE HYPAFIX 4 X10 (GAUZE/BANDAGES/DRESSINGS) ×3 IMPLANT
TOWEL OR 17X24 6PK STRL BLUE (TOWEL DISPOSABLE) ×6 IMPLANT
TRAY FOLEY CATH SILVER 14FR (SET/KITS/TRAYS/PACK) ×3 IMPLANT
WATER STERILE IRR 1000ML POUR (IV SOLUTION) ×3 IMPLANT

## 2015-11-30 NOTE — Anesthesia Preprocedure Evaluation (Addendum)
Anesthesia Evaluation  Patient identified by MRN, date of birth, ID band Patient awake    Reviewed: Allergy & Precautions, NPO status , Patient's Chart, lab work & pertinent test results  Airway Mallampati: II  TM Distance: >3 FB Neck ROM: Full    Dental   Pulmonary neg pulmonary ROS,    breath sounds clear to auscultation       Cardiovascular negative cardio ROS   Rhythm:Regular Rate:Normal     Neuro/Psych    GI/Hepatic GERD  ,  Endo/Other  diabetesHypothyroidism   Renal/GU      Musculoskeletal   Abdominal   Peds  Hematology   Anesthesia Other Findings   Reproductive/Obstetrics                            Anesthesia Physical Anesthesia Plan  ASA: II  Anesthesia Plan: General   Post-op Pain Management:    Induction: Intravenous  Airway Management Planned: Oral ETT  Additional Equipment:   Intra-op Plan:   Post-operative Plan: Extubation in OR  Informed Consent: I have reviewed the patients History and Physical, chart, labs and discussed the procedure including the risks, benefits and alternatives for the proposed anesthesia with the patient or authorized representative who has indicated his/her understanding and acceptance.   Dental advisory given  Plan Discussed with: CRNA and Anesthesiologist  Anesthesia Plan Comments:         Anesthesia Quick Evaluation

## 2015-11-30 NOTE — Op Note (Signed)
Kathy Molina PROCEDURE DATE: 11/30/2015  PREOPERATIVE DIAGNOSIS: Symptomatic uterine leiomyomata. POSTOPERATIVE DIAGNOSIS: The same PROCEDURE: Abdominal Myomectomy SURGEON:  Dr. Mora Bellman ASSISTANT: Dr. Ihor Dow  INDICATIONS: 35 y.o. G1P0 here for abdominal myomectomy for symptomatic uterine leiomyomata.  Risks of surgery were discussed with the patient including but not limited to: bleeding which may require transfusion or reoperation; infection which may require antibiotics; injury to bowel, bladder, ureters or other surrounding organs; need for additional procedures; thromboembolic phenomenon, incisional problems and other postoperative/anesthesia complications. Written informed consent was obtained.    FINDINGS:  Uterine leiomyomata. 9 in number, ranging in size  1- 12 cm. Total weight 853.9 gm. 12 cm fundal fibroid in close proximity to the left fallopian tube. Normal fallopian tubes and ovaries bilaterally  ANESTHESIA:    General INTRAVENOUS FLUIDS:1400  ml ESTIMATED BLOOD LOSS:200 ml URINE OUTPUT: 300 ml SPECIMENS: Leiomyomata sent to pathology COMPLICATIONS: None immediate  PROCEDURE IN DETAIL:  The patient received IV preoperative antibiotics approximately 30 minutes prior to procedure.  She was then taken to the operating room and general anesthesia was administered without difficulty.  .She was placed in dorsal supine position and prepped and draped in a sterile manner.  A Foley catheter was inserted into the bladder and attached to Luisdavid Hamblin drainage.  After an adequate timeout was performed, attention was then turned to the abdomen where a Pfannenstiel incision was made with a scalpel.  This incision was carried down to the fascia using electrocautery.  The fascia was incised in the midline and this incision was extended bilaterally using the Mayo scissors.  Kocher's were applied to the superior aspect of the fascial incision, and the rectus muscles were dissected off  bluntly and sharply using the Mayo scissors.  A similar process was carried out at the inferior aspect of the incision.  The rectus muscles were separated in the midline bluntly and the peritoneum was identified, picked up and incised with the scalpel.  This incision was extended superiorly and inferiorly using the scalpel with good visualization of the bladder and bowel.  Attention was then turned to the uterus where multiple uterine leiomyomata were noted.  The bowel were packed away with moist laparotomy sponges and an abdominal retractor was placed.  The uterus was then delivered up through the incision using a tenaculum to help in delivering up the uterus.  Attention was then turned to the uterine surface where the largest leiomyoma was identified. Vasopressin solution was injected over the surface of the leiomyoma to aid with hemostasis.   A vertical incision was made over the uterine leiomyoma into the leiomyoma, and the capsule was recognized.  Using blunt methods, the leiomyoma was freed from the surrounding myometrial tissue and removed intact.  Other leiomyomata were removed in similar fashion.  After removal of all the leiomyomata which were both on the anterior and posterior aspects of the uterus, the incisions were closed in layers, initiated by using 3-0 Vicryl in figure-of-eight stitches to close the breach into the endometrial cavity.  The deeper layers were also reapproximated using 0 Vicryl running stitches, and the serosa was reapproximated using 0 Vicryl imbricating stitch.  Overall good hemostasis was noted.  The uterus was returned to the abdomen.  The laparotomy sponges were then removed from the abdomen, and an Interceed sheet was placed over the uterus as an adhesive barrier. The abdominal retractor was then removed. Kelly clamps were placed on the peritoneum and the peritoneum was closed using 0 Vicryl running stitch.  The  fascia was reapproximated with 0 Vicryl running stitch and the  subcutaneous layer was reapproximated with 3-0 plain gut interrupted sutures.  The skin was closed with 3-0 Vicryl subcuticular stitch.  The patient tolerated the procedure well. There were no complications during this case.  Sponge, lap, needle and instrument counts were correct x2.  The patient was taken to the recovery room extubated and in stable condition.

## 2015-11-30 NOTE — Anesthesia Postprocedure Evaluation (Signed)
Anesthesia Post Note  Patient: Kelsha Burford  Procedure(s) Performed: Procedure(s) (LRB): MYOMECTOMY (N/A)  Patient location during evaluation: [facility] Anesthesia Type: General Level of consciousness: awake and alert and oriented Pain management: pain level controlled Vital Signs Assessment: post-procedure vital signs reviewed and stable Respiratory status: spontaneous breathing and respiratory function stable Cardiovascular status: blood pressure returned to baseline Postop Assessment: no signs of nausea or vomiting and adequate PO intake Anesthetic complications: no    Last Vitals:  Filed Vitals:   11/30/15 1959 11/30/15 2041  BP:  125/77  Pulse:  100  Temp:  37.4 C  Resp: 27 16    Last Pain:  Filed Vitals:   11/30/15 2042  PainSc: 5                  Marry Kusch

## 2015-11-30 NOTE — Transfer of Care (Signed)
Immediate Anesthesia Transfer of Care Note  Patient: Donnesha Chiarella  Procedure(s) Performed: Procedure(s): MYOMECTOMY (N/A)  Patient Location: PACU  Anesthesia Type:General  Level of Consciousness: awake, alert , oriented and patient cooperative  Airway & Oxygen Therapy: Patient Spontanous Breathing and Patient connected to nasal cannula oxygen  Post-op Assessment: Report given to RN and Post -op Vital signs reviewed and stable  Post vital signs: Reviewed and stable  Last Vitals:  Filed Vitals:   11/30/15 1135  BP: 128/88  Pulse: 113  Temp: 36.6 C  Resp: 20    Complications: No apparent anesthesia complications

## 2015-11-30 NOTE — Anesthesia Procedure Notes (Signed)
Procedure Name: Intubation Date/Time: 11/30/2015 12:56 PM Performed by: Tobin Chad Pre-anesthesia Checklist: Patient identified, Timeout performed, Emergency Drugs available, Suction available and Patient being monitored Patient Re-evaluated:Patient Re-evaluated prior to inductionOxygen Delivery Method: Circle system utilized and Simple face mask Preoxygenation: Pre-oxygenation with 100% oxygen Intubation Type: IV induction and Inhalational induction Ventilation: Mask ventilation without difficulty Laryngoscope Size: 3 and Mac Grade View: Grade I Tube type: Oral Number of attempts: 1 Placement Confirmation: ETT inserted through vocal cords under direct vision,  breath sounds checked- equal and bilateral and positive ETCO2 Secured at: 22 cm Tube secured with: Tape Dental Injury: Teeth and Oropharynx as per pre-operative assessment

## 2015-12-01 ENCOUNTER — Encounter (HOSPITAL_COMMUNITY): Payer: Self-pay | Admitting: Obstetrics and Gynecology

## 2015-12-01 LAB — CBC
HEMATOCRIT: 35.1 % — AB (ref 36.0–46.0)
Hemoglobin: 11.7 g/dL — ABNORMAL LOW (ref 12.0–15.0)
MCH: 27.3 pg (ref 26.0–34.0)
MCHC: 33.3 g/dL (ref 30.0–36.0)
MCV: 82 fL (ref 78.0–100.0)
Platelets: 243 10*3/uL (ref 150–400)
RBC: 4.28 MIL/uL (ref 3.87–5.11)
RDW: 14.4 % (ref 11.5–15.5)
WBC: 11.6 10*3/uL — ABNORMAL HIGH (ref 4.0–10.5)

## 2015-12-01 MED ORDER — IBUPROFEN 600 MG PO TABS
600.0000 mg | ORAL_TABLET | Freq: Four times a day (QID) | ORAL | Status: DC | PRN
  Administered 2015-12-01 – 2015-12-02 (×3): 600 mg via ORAL
  Filled 2015-12-01 (×4): qty 1

## 2015-12-01 MED ORDER — OXYCODONE-ACETAMINOPHEN 5-325 MG PO TABS
1.0000 | ORAL_TABLET | ORAL | Status: DC | PRN
  Administered 2015-12-01 (×2): 1 via ORAL
  Administered 2015-12-01 – 2015-12-02 (×3): 2 via ORAL
  Administered 2015-12-02: 1 via ORAL
  Filled 2015-12-01 (×3): qty 1
  Filled 2015-12-01: qty 2
  Filled 2015-12-01: qty 1
  Filled 2015-12-01 (×2): qty 2

## 2015-12-01 MED ORDER — DOCUSATE SODIUM 100 MG PO CAPS
100.0000 mg | ORAL_CAPSULE | Freq: Two times a day (BID) | ORAL | Status: DC
Start: 2015-12-01 — End: 2015-12-02
  Administered 2015-12-01 – 2015-12-02 (×3): 100 mg via ORAL
  Filled 2015-12-01 (×3): qty 1
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
## 2015-12-01 NOTE — Progress Notes (Signed)
1 Day Post-Op Procedure(s) (LRB): MYOMECTOMY (N/A)  Subjective: Patient reports feeling well this morning. Her pain is well managed with the PCA. She reports ambulating yesterday afternoon. She denies feeling dizzy or lightheaded. She denies chest pain or shortness of breath.  She tolerated clear fluids  Objective: I have reviewed patient's vital signs, intake and output and medications.  General: alert, cooperative and no distress Resp: clear to auscultation bilaterally Cardio: regular rate and rhythm GI: normal findings: soft, appropriately tender, non distended and incision: honeycomb dressing stained with dark blood. No erythema, induration or drainage Extremities: Homans sign is negative, no sign of DVT and no edema, redness or tenderness in the calves or thighs  Assessment: s/p Procedure(s): MYOMECTOMY (N/A): stable and progressing well  Plan: Encourage ambulation Advance to PO medication Discontinue IV fluids  Discontinue foley Follow up CBC Advance to a regular diet Family leave paper work completed and returned to the patient   LOS: 1 day    Kathy Molina 12/01/2015, 7:40 AM

## 2015-12-02 MED ORDER — IBUPROFEN 600 MG PO TABS: 600.0000 mg | ORAL_TABLET | Freq: Four times a day (QID) | ORAL | Status: DC | PRN

## 2015-12-02 MED ORDER — DOCUSATE SODIUM 100 MG PO CAPS
100.0000 mg | ORAL_CAPSULE | Freq: Two times a day (BID) | ORAL | Status: DC
Start: 2015-12-02 — End: 2023-01-11

## 2015-12-02 MED ORDER — OXYCODONE-ACETAMINOPHEN 5-325 MG PO TABS: 1.0000 | ORAL_TABLET | ORAL | Status: DC | PRN

## 2015-12-02 MED ORDER — DOCUSATE SODIUM 100 MG PO CAPS: 100.0000 mg | ORAL_CAPSULE | Freq: Two times a day (BID) | ORAL | Status: DC

## 2015-12-02 NOTE — Progress Notes (Signed)
Pt teaching complete  Ambulated out   

## 2015-12-02 NOTE — Discharge Summary (Signed)
Physician Discharge Summary  Patient ID: Kathy Molina MRN: [id number] DOB/AGE: Apr 19, 1981 35 y.o.  Admit date: 11/30/2015 Discharge date: 12/02/2015  Admission Diagnoses: Symptomatic fibroid uterus  Discharge Diagnoses:  Active Problems:   S/P myomectomy   Discharged Condition: good  Hospital Course: Patient admitted definitive treatment of fibroid uterus. Patient underwent an abdominal myomectomy where 9 fibroids with a total weight of 860 grams were removed. Patient had an uncomplicated post op course. She ambulated, passed flatus, voided and tolerated a regular diet. She remained afebrile. She was found stable for discharge on HD #2. Discharge instructions were provided. Patient will follow up in 4-6 weeks for post op check  Consults: None  Significant Diagnostic Studies: labs: pre op Hg 14--> 12 (post op)  Treatments: surgery: abdominal myomectomy  Discharge Exam: Blood pressure 109/67, pulse 96, temperature 98.1 F (36.7 C), temperature source Oral, resp. rate 18, height 5\' 9"  (1.753 m), weight 236 lb (107.049 kg), last menstrual period 09/24/2015, SpO2 98 %. General appearance: alert, cooperative and no distress Resp: clear to auscultation bilaterally Cardio: regular rate and rhythm GI: soft, non-tender; bowel sounds normal; no masses,  no organomegaly and honey comb dressing unchanged in appeareance from yesterday. No erythema, induration or drainage Extremities: Homans sign is negative, no sign of DVT and no edema, redness or tenderness in the calves or thighs  Disposition: 01-Home or Self Care     Medication List    STOP taking these medications        promethazine 25 MG tablet  Commonly known as:  PHENERGAN      TAKE these medications        cetirizine 10 MG tablet  Commonly known as:  ZYRTEC  Take 10 mg by mouth daily.     DEPO-PROVERA IM  Inject 1 Dose into the muscle every 3 (three) months.     diphenhydrAMINE 25 MG tablet  Commonly known as:   SOMINEX  Take 25 mg by mouth as needed for allergies.     docusate sodium 100 MG capsule  Commonly known as:  COLACE  Take 1 capsule (100 mg total) by mouth 2 (two) times daily.     docusate sodium 100 MG capsule  Commonly known as:  COLACE  Take 1 capsule (100 mg total) by mouth 2 (two) times daily.     EPIPEN 2-PAK 0.3 mg/0.3 mL Soaj injection  Generic drug:  EPINEPHrine  Inject 0.3 mg into the muscle as needed.     ibuprofen 600 MG tablet  Commonly known as:  ADVIL,MOTRIN  Take 1 tablet (600 mg total) by mouth every 6 (six) hours as needed for fever or headache.     oxyCODONE-acetaminophen 5-325 MG tablet  Commonly known as:  PERCOCET/ROXICET  Take 1-2 tablets by mouth every 4 (four) hours as needed for severe pain.     ranitidine 150 MG tablet  Commonly known as:  ZANTAC  Take 150 mg by mouth 2 (two) times daily.       Follow-up Information    Follow up with [facility].   Specialty:  Obstetrics and Gynecology   Why:  An appointment will be made for you to follow up with me in 4-6 weeks. call with any questions or concerns   Contact information:   [place] [phone]      Signed: [place] 12/02/2015, 11:37 AM

## 2015-12-02 NOTE — Discharge Instructions (Signed)
Myomectomy, Care After °Refer to this sheet in the next few weeks. These instructions provide you with information on caring for yourself after your procedure. Your health care provider may also give you more specific instructions. Your treatment has been planned according to current medical practices, but problems sometimes occur. Call your health care provider if you have any problems or questions after your procedure. °WHAT TO EXPECT AFTER THE PROCEDURE °After your procedure, it is typical to have the following: °· Pain in your abdomen, especially at any incision sites. You will be given pain medicine to control the pain. °· Tiredness. This is a normal part of the recovery process. Your energy level will return to normal over the next several weeks. °· Constipation. °· Vaginal bleeding. This is normal and should stop after 1-2 weeks. °HOME CARE INSTRUCTIONS  °· Only take over-the-counter or prescription medicines as directed by your health care provider. Avoid aspirin because it can cause bleeding. °· Do not douche, use tampons, or have sexual intercourse until given permission by your health care provider. °· Remove or change any bandages (dressings) as directed by your health care provider. °· Take showers instead of baths as directed by your health care provider. °· You will probably be able to go back to your normal routine after a few days. Do not do anything that requires extra effort until your health care provider says it is okay. Do not lift anything heavier than 15 pounds (6.8 kg) until your health care provider approves. °· Walk daily but take frequent rest breaks if you tire easily. °· Continue to practice deep breathing and coughing. If it hurts to cough, try holding a pillow against your belly as you cough. °· If you become constipated, you may: °¨ Use a mild laxative if your health care provider approves. °¨ Add more fruit and bran to your diet. °¨ Drink enough fluids to keep your urine clear or  pale yellow. °· Take your temperature twice a day and write it down. °· Do not drink alcohol. °· Do not drive until your health care provider approves. °· Have someone help you at home for 1 week or until you can do your own household activities. °· Follow up with your health care provider as directed. °SEEK MEDICAL CARE IF: °· You have a fever. °· You have increasing abdominal pain that is not relieved with medicine. °· You have nausea, vomiting, or diarrhea. °· You have pain when you urinate, or you have blood in your urine. °· You have a rash on your body. °· You have pain or redness where your IV access tube was inserted. °· You have redness, swelling, or any kind of drainage from an incision. °SEEK IMMEDIATE MEDICAL CARE IF:  °· You have weakness or lightheadedness. °· You have pain, swelling, or redness in your legs. °· You have chest pain. °· You faint. °· You have shortness of breath. °· You have heavy vaginal bleeding. °· Your incision is opening up. °  °This information is not intended to replace advice given to you by your health care provider. Make sure you discuss any questions you have with your health care provider. °  °Document Released: 03/29/2011 Document Revised: 11/27/2014 Document Reviewed: 06/18/2013 °Elsevier Interactive Patient Education ©2016 [employer] Inc[employer] ° °

## 2015-12-05 ENCOUNTER — Encounter: Payer: Self-pay | Admitting: Obstetrics and Gynecology

## 2015-12-06 ENCOUNTER — Encounter: Payer: Self-pay | Admitting: Obstetrics and Gynecology

## 2016-01-03 ENCOUNTER — Encounter: Payer: Self-pay | Admitting: Obstetrics and Gynecology

## 2016-01-14 ENCOUNTER — Encounter: Payer: Self-pay | Admitting: Obstetrics and Gynecology

## 2016-01-14 ENCOUNTER — Ambulatory Visit (INDEPENDENT_AMBULATORY_CARE_PROVIDER_SITE_OTHER): Payer: BLUE CROSS/BLUE SHIELD | Admitting: Obstetrics and Gynecology

## 2016-01-14 VITALS — BP 119/80 | HR 135 | Temp 98.4°F | Ht 69.0 in | Wt 227.7 lb

## 2016-01-14 DIAGNOSIS — Z9889 Other specified postprocedural states: Secondary | ICD-10-CM

## 2016-01-14 MED ORDER — ETONOGESTREL-ETHINYL ESTRADIOL 0.12-0.015 MG/24HR VA RING: VAGINAL_RING | VAGINAL | Status: DC

## 2016-01-14 NOTE — Progress Notes (Signed)
Patient ID: Kathy Molina, female   DOB: May 31, 1981, 35 y.o.   MRN: [id number] 35 yo [id number] here for post op check. Patient underwent an abdominal myomectomy on 11/30/2015. She reports feeling well since her surgery. Her pain is well controlled. She occasionally takes ibuprofen. She desires to get restarted on NuvaRing for contraception  Past Medical History  Diagnosis Date  . Migraine   . Fibroid uterus   . Depression   . Seasonal allergies   . GERD (gastroesophageal reflux disease)   . Sleep walking   . Hypothyroidism     was told she had low thyroid level, but no follow up since  . Diabetes mellitus without complication (HCC)     borderline  . Motion sickness    Past Surgical History  Procedure Laterality Date  . Myomectomy N/A 11/30/2015    Procedure: MYOMECTOMY;  Surgeon: Mora Bellman, MD;  Location: [facility];  Service: Gynecology;  Laterality: N/A;   No family history on file., Social History  Substance Use Topics  . Smoking status: Never Smoker   . Smokeless tobacco: Never Used  . Alcohol Use: Yes     Comment: occassionally   ROS See pertinent in HPI  Blood pressure 119/80, pulse 135, temperature 98.4 F (36.9 C), temperature source Oral, height 5\' 9"  (1.753 m), weight 227 lb 11.2 oz (103.284 kg).  GENERAL: Well-developed, well-nourished female in no acute distress.  ABDOMEN: Soft, nontender, nondistended. No organomegaly. Incision: no erythema, induration or drainage. Healed completely EXTREMITIES: No cyanosis, clubbing, or edema, 2+ distal pulses.  A/P 35 yo here for post op check s/p abdominal myomectomy - Patient is medically cleared to resume all activities of daily living - Weight loss management reviewed with the patient - Advised to take prenatal vitamins prior to conception - Rx NuvaRing provided - RTC prn

## 2016-01-28 ENCOUNTER — Encounter: Payer: Self-pay | Admitting: Obstetrics and Gynecology

## 2016-02-03 ENCOUNTER — Ambulatory Visit (INDEPENDENT_AMBULATORY_CARE_PROVIDER_SITE_OTHER): Payer: BLUE CROSS/BLUE SHIELD | Admitting: *Deleted

## 2016-02-03 DIAGNOSIS — R300 Dysuria: Secondary | ICD-10-CM | POA: Diagnosis not present

## 2016-02-03 LAB — POCT URINALYSIS DIP (DEVICE)
Bilirubin Urine: NEGATIVE
Glucose, UA: NEGATIVE mg/dL
HGB URINE DIPSTICK: NEGATIVE
Ketones, ur: NEGATIVE mg/dL
Leukocytes, UA: NEGATIVE
NITRITE: NEGATIVE
PH: 5.5 (ref 5.0–8.0)
PROTEIN: NEGATIVE mg/dL
Specific Gravity, Urine: 1.025 (ref 1.005–1.030)
Urobilinogen, UA: 0.2 mg/dL (ref 0.0–1.0)

## 2016-02-03 NOTE — Progress Notes (Signed)
Pt c/o dysuria, sample sent for culture. Will call patient with results.

## 2016-02-04 LAB — URINE CULTURE

## 2016-05-01 ENCOUNTER — Encounter: Payer: BLUE CROSS/BLUE SHIELD | Attending: Obstetrics and Gynecology | Admitting: Dietician

## 2016-05-01 ENCOUNTER — Encounter: Payer: Self-pay | Admitting: Dietician

## 2016-05-01 VITALS — Ht 68.0 in | Wt 231.0 lb

## 2016-05-01 DIAGNOSIS — Z713 Dietary counseling and surveillance: Secondary | ICD-10-CM | POA: Insufficient documentation

## 2016-05-01 DIAGNOSIS — R7303 Prediabetes: Secondary | ICD-10-CM

## 2016-05-01 NOTE — Patient Instructions (Addendum)
Consider talking to PCP about depression medicine not work in issues with sleep.  Aim to eat 3 meals per day. Have carbs and protein every you eat. Plan meals for the week on Sunday and go grocery shopping.  For smoothies - have 1 cup to 1.5 cups of fruit, spinach, and boiled egg on side. Try to have vegetables once a day.  Try cook vegetables on grill or roasted (400 degrees, olive oil, salt, pepper) and toss veggies about every 10 minutes until done. Or try sauteeing spinach with garlic (don't overcook).   Or look at [facility] for veggie ideas. Aim to work your way your up to 30 minutes 5 x week of exercise (total 150 minutes per day). Start walking/elliptical/treadmill 2-3 x week.

## 2016-05-01 NOTE — Progress Notes (Signed)
Medical Nutrition Therapy:  Appt start time: 0820 end time:  0915.   Assessment:  Primary concerns today: Kathy Molina is here today since today her Hgb A1c was 6.0% in May. It was elevated last year also. Has been trying to increase her steps but not getting her goal. Has cut out drinking tea and soda when going out to eat. Drinks a lot of water, though has cut back since she has an overactive bladder. Has depression which interferes with her motivation to exercise. Was taking Lexapro and felt that it stopped working so no longer taking it. Has also taken Zoloft which she felt did not work. Has seen a counselor in the past though not recently and not interested in doing that at this time. Had not talked to her PCP (prescribing doctor) about medicine not working.   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 Has some financial stress.  Lives by herself. Usually eats 2 meals per day. Eats out about 7 meals per week. Used to have smoothies but not recently.   Had fibroid surgery in January. Gets nauseas easily when aggravated or with eating. Not always sure of the cause. Also had migraines and will not eat when she gets them. Does not sleep well. Will walk and talk in her sleep. Had a sleep study in 2013 and nothing was found.   Goals: Would like to lose about 50 lbs. Would also like to get back to making smoothies about 3 x week. Wants to have her energy back.    Preferred Learning Style:   No preference indicated   Learning Readiness:   Ready  MEDICATIONS: see list   DIETARY INTAKE:  Usual eating pattern includes 2 meals and 0-1 snacks per day.  Avoided foods include: greens, a lot of vegetables   24-hr recall:  B ( AM): eggs and grits or eggs with cheese or Multigrain cheerios sometimes or on weekends [place] sausage with pancake or eggs/grits  Snk ( AM): none L ( PM): cookout burger part of a burger and fries or [employer] nuggets and fries or  shrimp and rice, baked chicken with rice and mushrooms Snk ( PM): none  D ( PM): breakfast for dinner, pizza, baked chicken with rice and mushrooms, spaghetti with meat sauce, shrimp Snk ( PM): none or cereal or ice cream sandwiches or candy Beverages:water, orange juice (not a lot), tea, soda   Usual physical activity: none lately  Estimated energy needs: 1800 calories 200 g carbohydrates 135 g protein 50 g fat  Progress Towards Goal(s):  In progress.   Nutritional Diagnosis:  NB-1.1 Food and nutrition-related knowledge deficit As related to hx of meal skipping, limited vegetable intake, and excess consumption of high fat carbohydrates.  As evidenced by Hgb A1c of 6.0% and BMI of 35.1.    Intervention:  Nutrition counseling provided. Plan: Consider talking to PCP about depression medicine not work in issues with sleep.  Aim to eat 3 meals per day. Have carbs and protein every you eat. Plan meals for the week on Sunday and go grocery shopping.  For smoothies - have 1 cup to 1.5 cups of fruit, spinach, and boiled egg on side. Try to have vegetables once a day.  Try cook vegetables on grill or roasted (400 degrees, olive oil, salt, pepper) and toss veggies about every 10 minutes until done. Or try sauteeing spinach with garlic (don't overcook).   Or look at [facility] for veggie  ideas. Aim to work your way your up to 30 minutes 5 x week of exercise (total 150 minutes per day). Start walking/elliptical/treadmill 2-3 x week.   Teaching Method Utilized:  Visual Auditory Hands on  Handouts given during visit include:  MyPlate  Meal Card  Snack Sheet  Blood sugar monitoring handout  Barriers to learning/adherence to lifestyle change: stress, financial issues, depression  Demonstrated degree of understanding via:  Teach Back   Monitoring/Evaluation:  Dietary intake, exercise, and body weight in 1 month(s).

## 2016-05-10 ENCOUNTER — Encounter: Payer: Self-pay | Admitting: Obstetrics and Gynecology

## 2016-05-30 ENCOUNTER — Encounter: Payer: BLUE CROSS/BLUE SHIELD | Attending: Obstetrics and Gynecology | Admitting: Dietician

## 2016-05-30 ENCOUNTER — Encounter: Payer: Self-pay | Admitting: Dietician

## 2016-05-30 VITALS — Ht 68.0 in | Wt 227.4 lb

## 2016-05-30 DIAGNOSIS — Z713 Dietary counseling and surveillance: Secondary | ICD-10-CM | POA: Diagnosis not present

## 2016-05-30 DIAGNOSIS — R7303 Prediabetes: Secondary | ICD-10-CM

## 2016-05-30 NOTE — Progress Notes (Signed)
Medical Nutrition Therapy:  Appt start time: 1100 end time:  1125   Assessment:  Primary concerns today: Kathy Molina is here today for a follow up for prediabetes. States that depression is worse. Lost 3 lbs since last month. Has been hiking 3 times over the past month and doing more walking. Feels like she can sleep better and feel less depressed with hiking. Feels like her eating is better but she is still working on it. Still skips some meals and doesn't eat when she is feeling depressed. Feels like she cannot afford to have snacks.    since today her Hgb A1c was 6.0% in May. It was elevated last year also. Has been trying to increase her steps but not getting her goal. Has cut out drinking tea and soda when going out to eat. Drinks a lot of water, though has cut back since she has an overactive bladder. Has depression which interferes with her motivation to exercise. Was taking Lexapro and felt that it stopped working so no longer taking it. Has also taken Zoloft which she felt did not work. Has seen a counselor in the past though not recently and not interested in doing that at this time. Had not talked to her PCP (prescribing doctor) about medicine not working.   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 Has some financial stress.  Lives by herself. Usually eats 2 meals per day. Eats out about 7 meals per week. Used to have smoothies but not recently.   Had fibroid surgery in January. Gets nauseas easily when aggravated or with eating. Not always sure of the cause. Also had migraines and will not eat when she gets them. Does not sleep well. Will walk and talk in her sleep. Had a sleep study in 2013 and nothing was found.   Goals: Would like to lose about 50 lbs. Would also like to get back to making smoothies about 3 x week. Wants to have her energy back.    Preferred Learning Style:   No preference indicated   Learning Readiness:    Ready  MEDICATIONS: see list   DIETARY INTAKE:  Usual eating pattern includes 2 meals and 0-1 snacks per day.  Avoided foods include: greens, a lot of vegetables   24-hr recall:  B ( AM): fruit or eggs with cheese or skips sometimes (better recently)  Snk ( AM): none or Multigrain Cheerios L ( PM): skips or pizza or chicken and rice/fries  Snk ( PM): none  D ( PM): none or breakfast for dinner, pizza, baked chicken with rice and mushrooms, spaghetti with meat sauce, shrimp Snk ( PM): none  Beverages:water, orange juice (not a lot), sweet tea  Usual physical activity: hiking on weekends and walking   Estimated energy needs: 1800 calories 200 g carbohydrates 135 g protein 50 g fat  Progress Towards Goal(s):  In progress.   Nutritional Diagnosis:  NB-1.1 Food and nutrition-related knowledge deficit As related to hx of meal skipping, limited vegetable intake, and excess consumption of high fat carbohydrates.  As evidenced by Hgb A1c of 6.0% and BMI of 35.1.    Intervention:  Nutrition counseling provided. Plan: Call [facility] to see about help with depression: [phone] Look into getting a PCP and scheduling a wellness exam (talk about sleep, depression).  Continue hiking as much as possible.  Try to do more rigorous exercise.   Teaching Method Utilized:  Visual Auditory Hands on  Handouts given during visit include:  none  Barriers to learning/adherence to lifestyle change: stress, financial issues, depression  Demonstrated degree of understanding via:  Teach Back   Monitoring/Evaluation:  Dietary intake, exercise, and body weight in 1 month(s).

## 2016-05-30 NOTE — Patient Instructions (Addendum)
Call [facility] to see about help with depression: [phone] Look into getting a PCP and scheduling a wellness exam (talk about sleep, depression).  Continue hiking as much as possible.  Try to do more rigorous exercise.   Continue:  Try to eat 3 meals per day. Have carbs and protein every you eat. Plan meals for the week on Sunday and go grocery shopping.  For smoothies - have 1 cup to 1.5 cups of fruit, spinach, and boiled egg on side. Try to have vegetables once a day.  Try cook vegetables on grill or roasted (400 degrees, olive oil, salt, pepper) and toss veggies about every 10 minutes until done. Or try sauteeing spinach with garlic (don't overcook).   Or look at [facility] for veggie ideas.

## 2016-06-27 ENCOUNTER — Ambulatory Visit: Payer: BLUE CROSS/BLUE SHIELD | Admitting: Dietician

## 2016-07-13 ENCOUNTER — Ambulatory Visit: Payer: BLUE CROSS/BLUE SHIELD | Admitting: Dietician

## 2016-08-03 ENCOUNTER — Ambulatory Visit: Payer: BLUE CROSS/BLUE SHIELD | Admitting: Dietician

## 2016-08-08 ENCOUNTER — Encounter: Payer: BLUE CROSS/BLUE SHIELD | Attending: Obstetrics and Gynecology | Admitting: Dietician

## 2016-08-08 DIAGNOSIS — Z713 Dietary counseling and surveillance: Secondary | ICD-10-CM | POA: Diagnosis present

## 2016-08-08 DIAGNOSIS — R7303 Prediabetes: Secondary | ICD-10-CM

## 2016-08-08 NOTE — Progress Notes (Signed)
  Medical Nutrition Therapy:  Appt start time: 1140 end time:  1215   Assessment:  Primary concerns today: Kathy Molina is here today for a follow up for prediabetes. Returns with a 2 lbs weight gain. Started going to therapy ([facility]) every week for about the past two month. Therapist made a referral to a psychiatrist which she will see once she gets paid. Still feeling depressed. Still not sleeping well. Has not changed eating much and skipping a lot of meals.   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   Will be following up about fibroids/cysts on Monday. Has not found PCP yet.   Goals: Would like to lose about 50 lbs. Would also like to get back to making smoothies about 3 x week. Wants to have her energy back.    Preferred Learning Style:   No preference indicated   Learning Readiness:   Ready  MEDICATIONS: see list   DIETARY INTAKE:  Usual eating pattern includes 2 meals and 0-1 snacks per day.  Avoided foods include: greens, a lot of vegetables   24-hr recall:  B ( AM): fruit or eggs with cheese or skips sometimes (better recently)  Snk ( AM): none or Multigrain Cheerios L ( PM): skips or pizza or chicken and rice/fries  Snk ( PM): none  D ( PM): none or breakfast for dinner, pizza, baked chicken with rice and mushrooms, spaghetti with meat sauce, shrimp Snk ( PM): none  Beverages:water, orange juice (not a lot), sweet tea  Usual physical activity: hiking on weekends and walking   Estimated energy needs: 1800 calories 200 g carbohydrates 135 g protein 50 g fat  Progress Towards Goal(s):  In progress.   Nutritional Diagnosis:  NB-1.1 Food and nutrition-related knowledge deficit As related to hx of meal skipping, limited vegetable intake, and excess consumption of high fat carbohydrates.  As evidenced by Hgb A1c of 6.0% and BMI of 35.1.    Intervention:  Nutrition counseling provided. Plan: Try to eat 3 meals per  day. Get frozen vegetables instead of fresh Have carbs and protein every you eat. Try to walk on Mondays and Wednesdays  Teaching Method Utilized:  Visual Auditory Hands on  Handouts given during visit include:  none  Barriers to learning/adherence to lifestyle change: stress, financial issues, depression  Demonstrated degree of understanding via:  Teach Back   Monitoring/Evaluation:  Dietary intake, exercise, and body weight in 2 month(s).

## 2016-08-08 NOTE — Patient Instructions (Addendum)
Try to eat 3 meals per day. Get frozen vegetables instead of fresh Have carbs and protein every you eat. Try to walk on Mondays and Wednesdays

## 2016-10-10 ENCOUNTER — Encounter: Payer: BLUE CROSS/BLUE SHIELD | Attending: Obstetrics and Gynecology | Admitting: Dietician

## 2016-10-10 DIAGNOSIS — Z713 Dietary counseling and surveillance: Secondary | ICD-10-CM | POA: Diagnosis present

## 2016-10-10 DIAGNOSIS — R7303 Prediabetes: Secondary | ICD-10-CM
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
## 2016-10-10 NOTE — Patient Instructions (Addendum)
Try frozen meals (Healthy Choice, Smart Ones, [employer]) Get frozen vegetables instead of fresh Have carbs and protein every you eat. Plan to exercise about 3 x week.  For job searching help: [email or web address]

## 2016-12-11 ENCOUNTER — Ambulatory Visit: Payer: BLUE CROSS/BLUE SHIELD | Admitting: Skilled Nursing Facility1

## 2016-12-29 ENCOUNTER — Ambulatory Visit: Payer: BLUE CROSS/BLUE SHIELD | Admitting: Registered"

## 2017-05-06 ENCOUNTER — Encounter (HOSPITAL_BASED_OUTPATIENT_CLINIC_OR_DEPARTMENT_OTHER): Payer: Self-pay

## 2017-05-06 ENCOUNTER — Emergency Department (HOSPITAL_BASED_OUTPATIENT_CLINIC_OR_DEPARTMENT_OTHER)
Admission: EM | Admit: 2017-05-06 | Discharge: 2017-05-06 | Disposition: A | Discharge status: Home / Self Care | Payer: BLUE CROSS/BLUE SHIELD | Attending: Emergency Medicine | Admitting: Emergency Medicine

## 2017-05-06 DIAGNOSIS — Z793 Long term (current) use of hormonal contraceptives: Secondary | ICD-10-CM | POA: Insufficient documentation

## 2017-05-06 DIAGNOSIS — R197 Diarrhea, unspecified: Secondary | ICD-10-CM

## 2017-05-06 DIAGNOSIS — E119 Type 2 diabetes mellitus without complications: Secondary | ICD-10-CM | POA: Insufficient documentation

## 2017-05-06 DIAGNOSIS — Z79899 Other long term (current) drug therapy: Secondary | ICD-10-CM | POA: Insufficient documentation

## 2017-05-06 DIAGNOSIS — R112 Nausea with vomiting, unspecified: Secondary | ICD-10-CM

## 2017-05-06 DIAGNOSIS — E039 Hypothyroidism, unspecified: Secondary | ICD-10-CM | POA: Insufficient documentation

## 2017-05-06 LAB — URINALYSIS, ROUTINE W REFLEX MICROSCOPIC
Bilirubin Urine: NEGATIVE
GLUCOSE, UA: NEGATIVE mg/dL
Ketones, ur: 15 mg/dL — AB
LEUKOCYTES UA: NEGATIVE
Nitrite: NEGATIVE
PROTEIN: NEGATIVE mg/dL
Specific Gravity, Urine: 1.016 (ref 1.005–1.030)
pH: 7 (ref 5.0–8.0)

## 2017-05-06 LAB — CBC
HCT: 40.8 % (ref 36.0–46.0)
Hemoglobin: 14.1 g/dL (ref 12.0–15.0)
MCH: 27.5 pg (ref 26.0–34.0)
MCHC: 34.6 g/dL (ref 30.0–36.0)
MCV: 79.7 fL (ref 78.0–100.0)
PLATELETS: 283 10*3/uL (ref 150–400)
RBC: 5.12 MIL/uL — AB (ref 3.87–5.11)
RDW: 13.9 % (ref 11.5–15.5)
WBC: 10.4 10*3/uL (ref 4.0–10.5)

## 2017-05-06 LAB — COMPREHENSIVE METABOLIC PANEL
ALT: 41 U/L (ref 14–54)
AST: 37 U/L (ref 15–41)
Albumin: 3.4 g/dL — ABNORMAL LOW (ref 3.5–5.0)
Alkaline Phosphatase: 54 U/L (ref 38–126)
Anion gap: 11 (ref 5–15)
BILIRUBIN TOTAL: 0.4 mg/dL (ref 0.3–1.2)
BUN: 6 mg/dL (ref 6–20)
CALCIUM: 8.6 mg/dL — AB (ref 8.9–10.3)
CO2: 25 mmol/L (ref 22–32)
CREATININE: 1 mg/dL (ref 0.44–1.00)
Chloride: 99 mmol/L — ABNORMAL LOW (ref 101–111)
Glucose, Bld: 92 mg/dL (ref 65–99)
Potassium: 2.8 mmol/L — ABNORMAL LOW (ref 3.5–5.1)
Sodium: 135 mmol/L (ref 135–145)
TOTAL PROTEIN: 7.3 g/dL (ref 6.5–8.1)

## 2017-05-06 LAB — LIPASE, BLOOD: LIPASE: 24 U/L (ref 11–51)

## 2017-05-06 LAB — URINALYSIS, MICROSCOPIC (REFLEX)

## 2017-05-06 LAB — PREGNANCY, URINE: Preg Test, Ur: NEGATIVE

## 2017-05-06 MED ORDER — SODIUM CHLORIDE 0.9 % IV BOLUS (SEPSIS)
1000.0000 mL | Freq: Once | INTRAVENOUS | Status: AC
  Administered 2017-05-06: 1000 mL via INTRAVENOUS

## 2017-05-06 MED ORDER — ONDANSETRON HCL 4 MG/2ML IJ SOLN
4.0000 mg | Freq: Once | INTRAMUSCULAR | Status: AC | PRN
  Administered 2017-05-06: 4 mg via INTRAVENOUS
  Filled 2017-05-06: qty 2

## 2017-05-06 MED ORDER — ONDANSETRON 4 MG PO TBDP: 4.0000 mg | ORAL_TABLET | Freq: Three times a day (TID) | ORAL | 0 refills | Status: DC | PRN

## 2017-05-06 MED ORDER — POTASSIUM CHLORIDE CRYS ER 20 MEQ PO TBCR
40.0000 meq | EXTENDED_RELEASE_TABLET | Freq: Once | ORAL | Status: AC
  Administered 2017-05-06: 40 meq via ORAL
  Filled 2017-05-06: qty 2

## 2017-05-06 NOTE — ED Triage Notes (Signed)
Reports abd pain n/v/d since Wednesday with loss of appetite.

## 2017-05-06 NOTE — ED Notes (Signed)
Pt discharged to home with family. NAD.  

## 2017-05-06 NOTE — ED Provider Notes (Signed)
[facility] Provider Note   CSN: [id number] Arrival date & time: 05/06/17  1938   By signing my name below, I, Eunice Blase, attest that this documentation has been prepared under the direction and in the presence of Nikolos Billig, Grayce Sessions, MD. Electronically signed, Eunice Blase, ED Scribe. 05/06/17. 9:12 PM.   History   Chief Complaint Chief Complaint  Patient presents with  . Abdominal Pain   The history is provided by the patient and medical records. No language interpreter was used.    Kathy Molina is a 36 y.o. female with h/o GERD presenting to the Emergency Department with chief complaint of worsening N/V/D x 4 days. Associated abdominal pain, appetite loss and fatigue noted. Pt states she vomits everything she consumes and her diarrhea is very dark brown. Pt describes cramping, intermittent LUQ pain worse with contact. She has used a heating pad mild relief to abdominal pain. Pt allegedly ate reheated pizza and cheese puffs prior to onset of vomiting, which was the first symptom she experienced. Pt has had nausea medications, ginger ale and water without relief. Pt states she vomits easily. She further states she finished a 10 day course of abx  ~2 weeks ago. 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 No bloody vomit or diarrhea. No fevers or dysuria.  Past Medical History:  Diagnosis Date  . Depression   . Diabetes mellitus without complication (HCC)    borderline  . Fibroid uterus   . GERD (gastroesophageal reflux disease)   . Hypothyroidism    was told she had low thyroid level, but no follow up since  . Migraine   . Motion sickness   . Seasonal allergies   . Sleep walking     Patient Active Problem List   Diagnosis Date Noted  . S/P myomectomy 11/30/2015    Past Surgical History:  Procedure Laterality Date  . MYOMECTOMY N/A 11/30/2015   Procedure: MYOMECTOMY;  Surgeon: Mora Bellman, MD;  Location: [facility];   Service: Gynecology;  Laterality: N/A;    OB History    Gravida Para Term Preterm AB Living   1             SAB TAB Ectopic Multiple Live Births                   Home Medications    Prior to Admission medications   Medication Sig Start Date End Date Taking? Authorizing Provider  DULoxetine (CYMBALTA) 30 MG capsule Take 30 mg by mouth daily.   Yes [provider]  montelukast (SINGULAIR) 10 MG tablet Take 10 mg by mouth at bedtime.   Yes [provider]  cetirizine (ZYRTEC) 10 MG tablet Take 10 mg by mouth daily.    [provider]  diphenhydrAMINE (SOMINEX) 25 MG tablet Take 25 mg by mouth as needed for allergies. Reported on 01/14/2016    [provider]  docusate sodium (COLACE) 100 MG capsule Take 1 capsule (100 mg total) by mouth 2 (two) times daily. 12/02/15   Constant, Peggy, MD  docusate sodium (COLACE) 100 MG capsule Take 1 capsule (100 mg total) by mouth 2 (two) times daily. 12/02/15   Constant, Peggy, MD  EPINEPHrine (EPIPEN 2-PAK) 0.3 mg/0.3 mL IJ SOAJ injection Inject 0.3 mg into the muscle as needed.    [provider]  etonogestrel-ethinyl estradiol (NUVARING) 0.12-0.015 MG/24HR vaginal ring Insert vaginally and leave in place for 3 consecutive weeks, then remove  for 1 week. 01/14/16   Constant, Peggy, MD  ibuprofen (ADVIL,MOTRIN) 600 MG tablet Take 1 tablet (600 mg total) by mouth every 6 (six) hours as needed for fever or headache. 12/02/15   Constant, Peggy, MD  MedroxyPROGESTERone Acetate (DEPO-PROVERA IM) Inject 1 Dose into the muscle every 3 (three) months. Reported on 01/14/2016    [provider]  ondansetron (ZOFRAN ODT) 4 MG disintegrating tablet Take 1 tablet (4 mg total) by mouth every 8 (eight) hours as needed for nausea or vomiting. 05/06/17   Berlin Viereck, Grayce Sessions, MD  oxyCODONE-acetaminophen (PERCOCET/ROXICET) 5-325 MG tablet Take 1-2 tablets by mouth every 4 (four) hours as needed for severe pain. Patient not  taking: Reported on 01/14/2016 12/02/15   Constant, Peggy, MD  ranitidine (ZANTAC) 150 MG tablet Take 150 mg by mouth 2 (two) times daily.    [provider]    Family History No family history on file.  Social History Social History  Substance Use Topics  . Smoking status: Never Smoker  . Smokeless tobacco: Never Used  . Alcohol use Yes     Comment: occassionally     Allergies   Augmentin [amoxicillin-pot clavulanate]; Other; Fish oil; and Shellfish allergy   Review of Systems Review of Systems All other systems reviewed and all systems are negative for acute changes except as noted in the HPI and PMH.    Physical Exam Updated Vital Signs BP (!) 135/98 (BP Location: Left Arm)   Pulse (!) 116   Temp 98.5 F (36.9 C) (Oral)   Resp 18   Ht 5\' 9"  (1.753 m)   Wt 236 lb (107 kg)   LMP 04/15/2017   SpO2 98%   BMI 34.85 kg/m   Physical Exam  Constitutional: She is oriented to person, place, and time. She appears well-developed and well-nourished. No distress.  HENT:  Head: Normocephalic and atraumatic.  Nose: Nose normal.  Eyes: Conjunctivae and EOM are normal. Pupils are equal, round, and reactive to light. Right eye exhibits no discharge. Left eye exhibits no discharge. No scleral icterus.  Neck: Normal range of motion. Neck supple.  Cardiovascular: Normal rate and regular rhythm.  Exam reveals no gallop and no friction rub.   No murmur heard. Pulmonary/Chest: Effort normal and breath sounds normal. No stridor. No respiratory distress. She has no rales.  Abdominal: Soft. She exhibits no distension. There is no tenderness. There is no rigidity, no rebound, no guarding and no CVA tenderness.  Musculoskeletal: She exhibits no edema or tenderness.  Neurological: She is alert and oriented to person, place, and time.  Skin: Skin is warm and dry. No rash noted. She is not diaphoretic. No erythema.  Psychiatric: She has a normal mood and affect.  Vitals  reviewed.    ED Treatments / Results  DIAGNOSTIC STUDIES: Oxygen Saturation is 98% on RA, NL by my interpretation.    COORDINATION OF CARE: 8:45 PM-Discussed next steps with pt. Pt verbalized understanding and is agreeable with the plan.    Labs (all labs ordered are listed, but only abnormal results are displayed) Labs Reviewed  URINALYSIS, ROUTINE W REFLEX MICROSCOPIC - Abnormal; Notable for the following:       Result Value   Hgb urine dipstick SMALL (*)    Ketones, ur 15 (*)    All other components within normal limits  URINALYSIS, MICROSCOPIC (REFLEX) - Abnormal; Notable for the following:    Bacteria, UA FEW (*)    Squamous Epithelial / LPF 0-5 (*)  All other components within normal limits  COMPREHENSIVE METABOLIC PANEL - Abnormal; Notable for the following:    Potassium 2.8 (*)    Chloride 99 (*)    Calcium 8.6 (*)    Albumin 3.4 (*)    All other components within normal limits  CBC - Abnormal; Notable for the following:    RBC 5.12 (*)    All other components within normal limits  PREGNANCY, URINE  LIPASE, BLOOD    EKG  EKG Interpretation None       Radiology No results found.  Procedures Procedures (including critical care time)  Medications Ordered in ED Medications  ondansetron (ZOFRAN) injection 4 mg (4 mg Intravenous Given 05/06/17 2040)  sodium chloride 0.9 % bolus 1,000 mL (1,000 mLs Intravenous New Bag/Given 05/06/17 2036)  potassium chloride SA (K-DUR,KLOR-CON) CR tablet 40 mEq (40 mEq Oral Given 05/06/17 2209)     Initial Impression / Assessment and Plan / ED Course  I have reviewed the triage vital signs and the nursing notes.  Pertinent labs & imaging results that were available during my care of the patient were reviewed by me and considered in my medical decision making (see chart for details).     36 y.o. female presents with vomiting, diarrhea for 4 days. No known possible suspicious food intake. Inadequate oral tolerance. Rest  of history as above.  Patient appears well, not in distress, and with no signs of toxicity or dehydration. Abdomen benign. Rest of the exam as above  Triage labs obtained revealing mild hypokalemia otherwise grossly reassuring. Potassium repleted orally.  Low suspicion for serious intra-abdominal inflammatory/infectious process. Doubt appendicitis, diverticulitis, severe colitis, dysentery.    Most consistent with viral gastroenteritis.   Able to tolerate oral intake in the ED.  Discussed symptomatic treatment with the patient and they will follow closely with their PCP.   Final Clinical Impressions(s) / ED Diagnoses   Final diagnoses:  Nausea vomiting and diarrhea   Disposition: Discharge  Condition: Good  I have discussed the results, Dx and Tx plan with the patient who expressed understanding and agree(s) with the plan. Discharge instructions discussed at great length. The patient was given strict return precautions who verbalized understanding of the instructions. No further questions at time of discharge.    New Prescriptions   ONDANSETRON (ZOFRAN ODT) 4 MG DISINTEGRATING TABLET    Take 1 tablet (4 mg total) by mouth every 8 (eight) hours as needed for nausea or vomiting.    Follow Up: [facility], [facility]  Schedule an appointment as soon as possible for a visit  in 5-7 days, If symptoms do not improve or  worsen   I personally performed the services described in this documentation, which was scribed in my presence. The recorded information has been reviewed and is accurate.        Fatima Blank, MD 05/06/17 [phone]

## 2017-07-13 IMAGING — US US PELVIS COMPLETE
1 series · 15 of 25 positions shown · non-contrast
Comparison: None available in PACs

CLINICAL DATA: Patient has a known history of fibroids

EXAM:
TRANSABDOMINAL AND TRANSVAGINAL ULTRASOUND OF PELVIS
TECHNIQUE: Both transabdominal and transvaginal ultrasound examinations of the
pelvis were performed. Transabdominal technique was performed for
global imaging of the pelvis including uterus, ovaries, adnexal
regions, and pelvic cul-de-sac. It was necessary to proceed with
endovaginal exam following the transabdominal exam to visualize the
uterus, endometrium, ovaries, and adnexal region..

[Series 1: us pelvis complete · 15 of 61 slices shown]
[im 1/61]
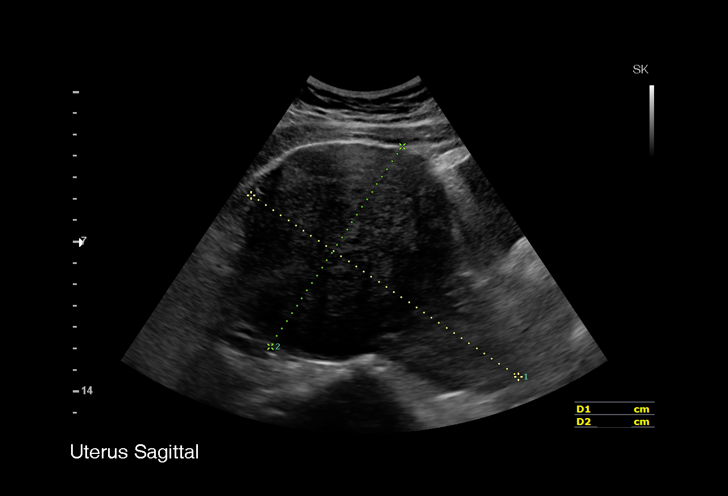
[im 6/61]
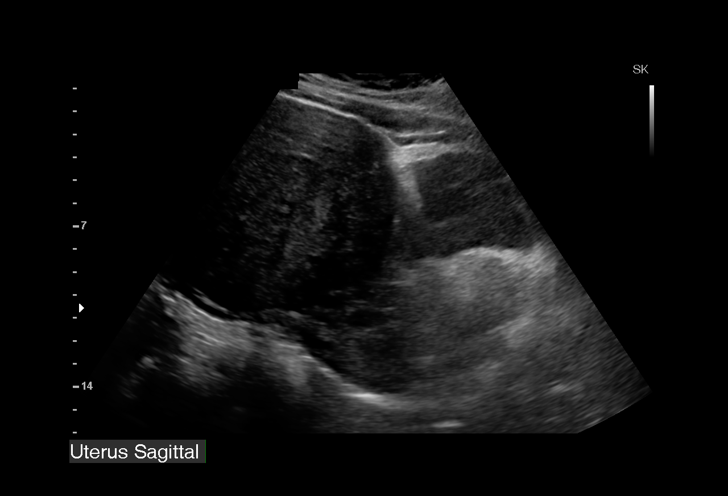
[im 11/61]
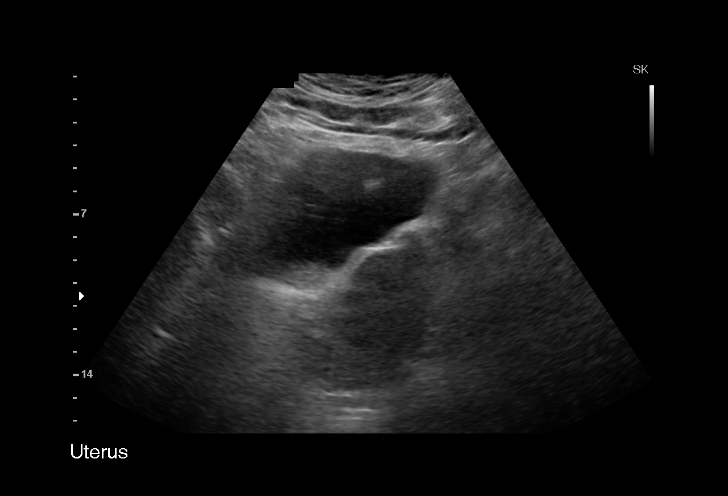
[im 13/61]
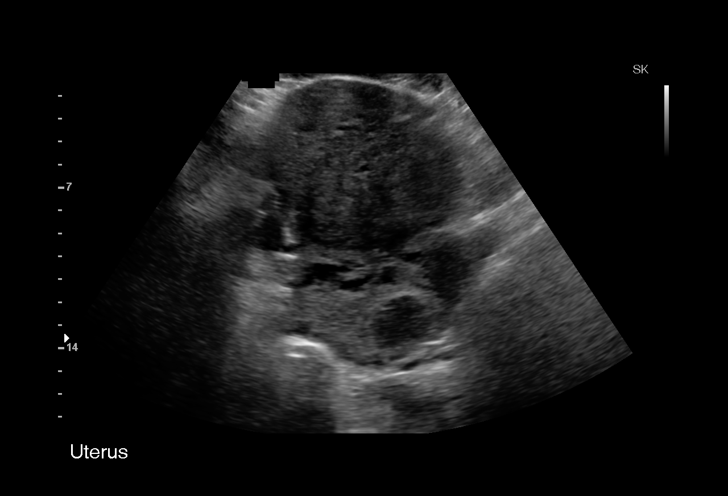
[im 18/61]
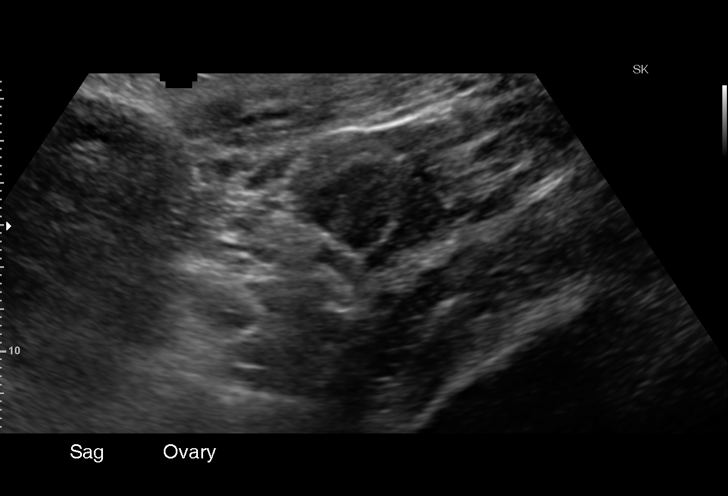
[im 23/61]
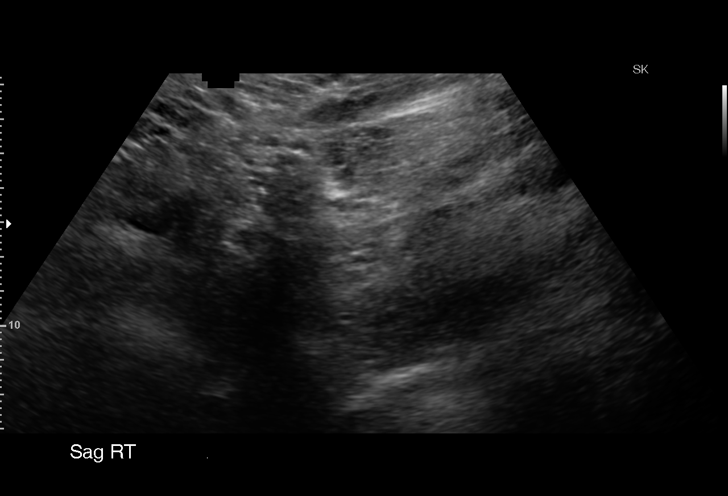
[im 26/61]
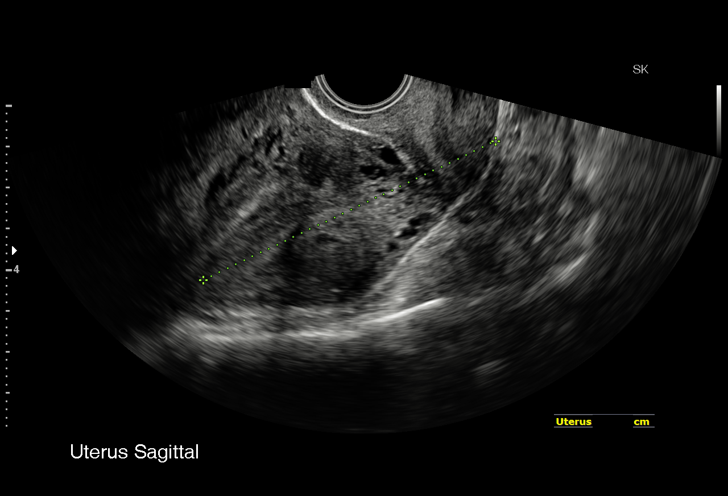
[im 31/61]
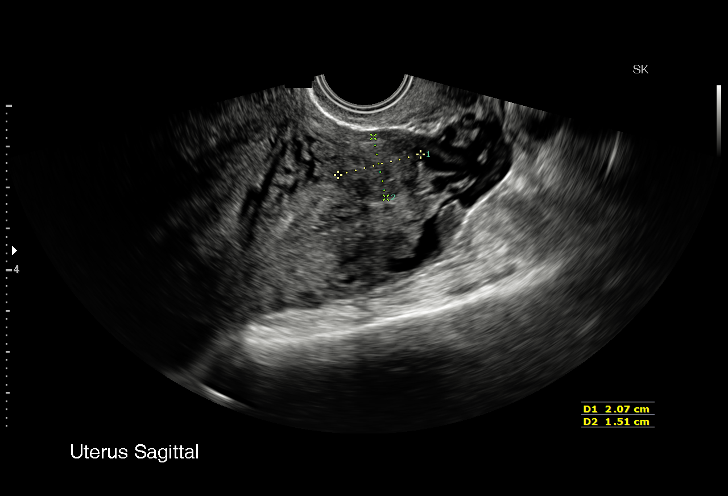
[im 36/61]
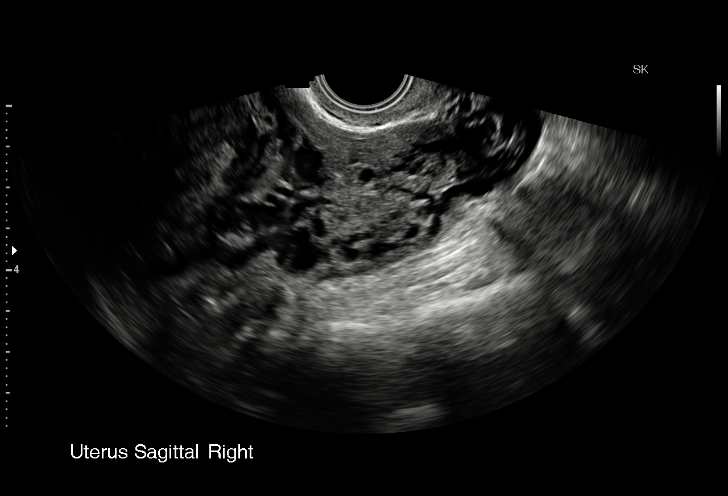
[im 38/61]
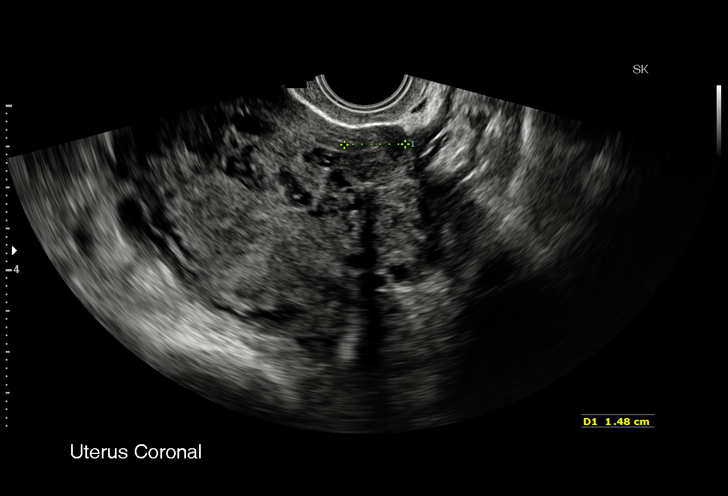
[im 43/61]
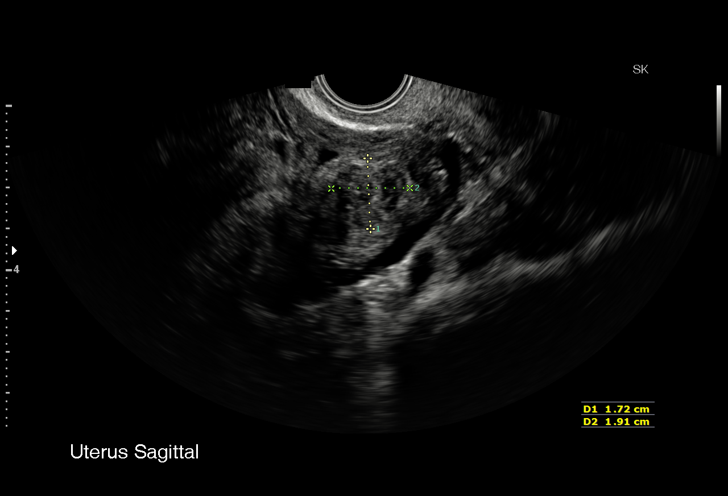
[im 48/61]
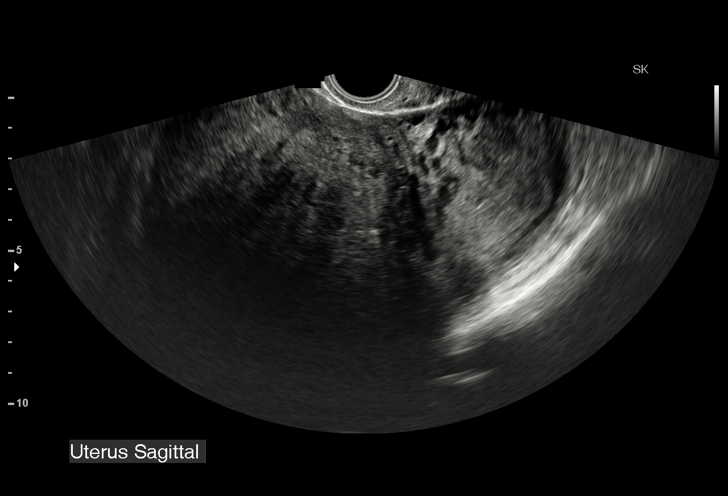
[im 51/61]
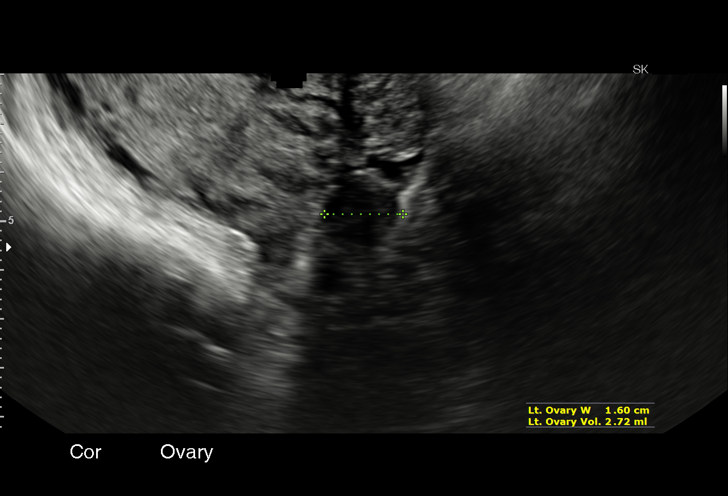
[im 56/61]
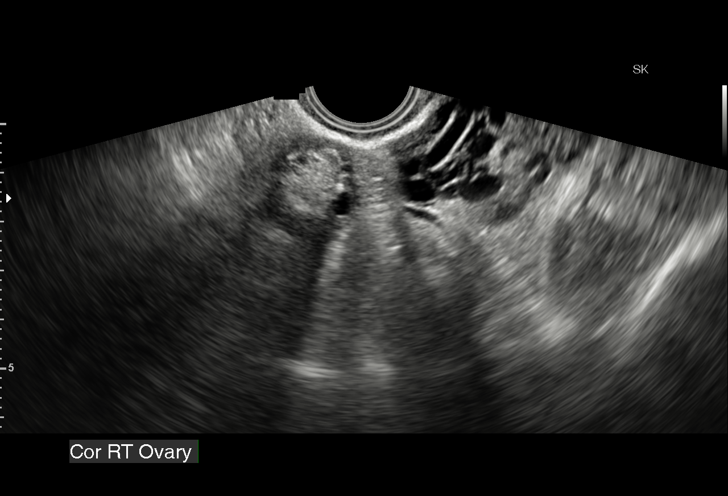
[im 61/61]
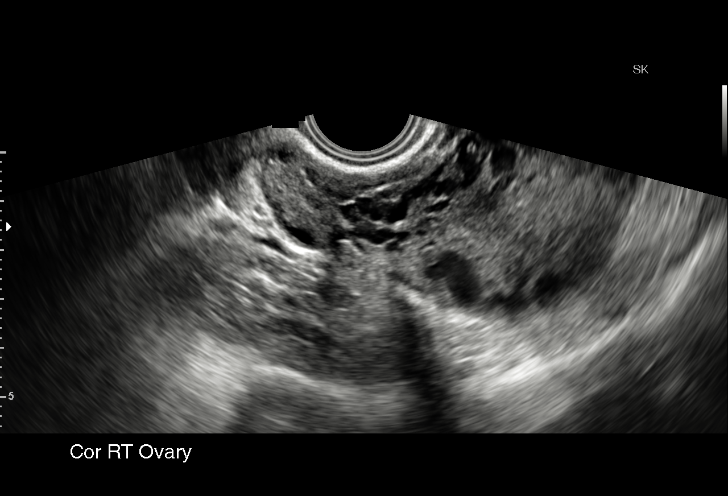

[15 of 25 positions shown; findings below may reference images not displayed]

FINDINGS: Uterus

Measurements: 7.9 x 3.9 x 5.9 cm. There is a dominant superior
fundal fibroid measuring 11 x 10 x 10.9 cm. There is a posterior
fibroid in the mid uterine corpus measuring 2.2 cm in greatest
dimension. There are 3 fibroids in the left aspect of the uterine
corpus. These measure up to 2.1 cm in diameter.

Endometrium

Thickness: 7.7 mm.  No focal abnormality visualized.

Right ovary

Measurements: 3.2 x 2.2 x 2.2 cm. There is an echogenic focus within
the substance of the right ovary which measures 1.5 x 1.4 x 1.4 cm.
There is a small amount of distal shadowing.

Left ovary

Measurements: 2.5 x 1.6 x 1.6 cm. Normal appearance/no adnexal mass.

Other findings

There is no free pelvic fluid.
IMPRESSION: 1. Very large superior uterine fundal fibroid measuring nearly 11 cm
in overall diameter. Multiple smaller fibroids measuring up to
cm in greatest dimension. The endometrial stripe is normal.
2. There is an echogenic focus in the right ovary without
significant increased vascularity that suggests a dermoid.
3. The left ovary is unremarkable.

## 2019-06-16 ENCOUNTER — Other Ambulatory Visit: Payer: Self-pay

## 2019-06-16 DIAGNOSIS — Z20822 Contact with and (suspected) exposure to covid-19: Secondary | ICD-10-CM

## 2019-06-17 LAB — NOVEL CORONAVIRUS, NAA: SARS-CoV-2, NAA: NOT DETECTED

## 2022-01-30 DIAGNOSIS — L83 Acanthosis nigricans: Secondary | ICD-10-CM | POA: Diagnosis not present

## 2022-02-15 DIAGNOSIS — E559 Vitamin D deficiency, unspecified: Secondary | ICD-10-CM | POA: Diagnosis not present

## 2022-02-15 DIAGNOSIS — I10 Essential (primary) hypertension: Secondary | ICD-10-CM | POA: Diagnosis not present

## 2022-02-15 DIAGNOSIS — E669 Obesity, unspecified: Secondary | ICD-10-CM | POA: Diagnosis not present

## 2022-02-15 DIAGNOSIS — F332 Major depressive disorder, recurrent severe without psychotic features: Secondary | ICD-10-CM | POA: Diagnosis not present

## 2022-02-15 DIAGNOSIS — R0683 Snoring: Secondary | ICD-10-CM | POA: Diagnosis not present

## 2022-03-17 DIAGNOSIS — K219 Gastro-esophageal reflux disease without esophagitis: Secondary | ICD-10-CM | POA: Diagnosis not present

## 2022-03-17 DIAGNOSIS — I10 Essential (primary) hypertension: Secondary | ICD-10-CM | POA: Diagnosis not present

## 2022-04-19 DIAGNOSIS — I10 Essential (primary) hypertension: Secondary | ICD-10-CM | POA: Diagnosis not present

## 2022-04-19 DIAGNOSIS — F332 Major depressive disorder, recurrent severe without psychotic features: Secondary | ICD-10-CM | POA: Diagnosis not present

## 2022-04-19 DIAGNOSIS — Z23 Encounter for immunization: Secondary | ICD-10-CM | POA: Diagnosis not present

## 2022-04-25 DIAGNOSIS — I10 Essential (primary) hypertension: Secondary | ICD-10-CM | POA: Diagnosis not present

## 2022-04-25 DIAGNOSIS — L299 Pruritus, unspecified: Secondary | ICD-10-CM | POA: Diagnosis not present

## 2022-05-04 DIAGNOSIS — Z01419 Encounter for gynecological examination (general) (routine) without abnormal findings: Secondary | ICD-10-CM | POA: Diagnosis not present

## 2022-05-04 DIAGNOSIS — I10 Essential (primary) hypertension: Secondary | ICD-10-CM | POA: Diagnosis not present

## 2022-05-04 DIAGNOSIS — R87810 Cervical high risk human papillomavirus (HPV) DNA test positive: Secondary | ICD-10-CM | POA: Diagnosis not present

## 2022-05-04 DIAGNOSIS — Z113 Encounter for screening for infections with a predominantly sexual mode of transmission: Secondary | ICD-10-CM | POA: Diagnosis not present

## 2022-05-04 DIAGNOSIS — D259 Leiomyoma of uterus, unspecified: Secondary | ICD-10-CM | POA: Diagnosis not present

## 2022-05-04 DIAGNOSIS — R87610 Atypical squamous cells of undetermined significance on cytologic smear of cervix (ASC-US): Secondary | ICD-10-CM | POA: Diagnosis not present

## 2022-05-08 DIAGNOSIS — I10 Essential (primary) hypertension: Secondary | ICD-10-CM | POA: Diagnosis not present

## 2022-05-26 DIAGNOSIS — L810 Postinflammatory hyperpigmentation: Secondary | ICD-10-CM | POA: Diagnosis not present

## 2022-05-26 DIAGNOSIS — R079 Chest pain, unspecified: Secondary | ICD-10-CM | POA: Diagnosis not present

## 2022-05-26 DIAGNOSIS — I10 Essential (primary) hypertension: Secondary | ICD-10-CM | POA: Diagnosis not present

## 2022-05-30 DIAGNOSIS — F4323 Adjustment disorder with mixed anxiety and depressed mood: Secondary | ICD-10-CM | POA: Diagnosis not present

## 2022-06-08 DIAGNOSIS — I119 Hypertensive heart disease without heart failure: Secondary | ICD-10-CM | POA: Diagnosis not present

## 2022-06-08 DIAGNOSIS — G4733 Obstructive sleep apnea (adult) (pediatric): Secondary | ICD-10-CM | POA: Diagnosis not present

## 2022-06-08 DIAGNOSIS — R079 Chest pain, unspecified: Secondary | ICD-10-CM | POA: Diagnosis not present

## 2022-06-08 DIAGNOSIS — I10 Essential (primary) hypertension: Secondary | ICD-10-CM | POA: Diagnosis not present

## 2022-06-15 DIAGNOSIS — F4323 Adjustment disorder with mixed anxiety and depressed mood: Secondary | ICD-10-CM | POA: Diagnosis not present

## 2022-06-21 DIAGNOSIS — D251 Intramural leiomyoma of uterus: Secondary | ICD-10-CM | POA: Diagnosis not present

## 2022-06-21 DIAGNOSIS — I10 Essential (primary) hypertension: Secondary | ICD-10-CM | POA: Diagnosis not present

## 2022-06-21 DIAGNOSIS — N920 Excessive and frequent menstruation with regular cycle: Secondary | ICD-10-CM | POA: Diagnosis not present

## 2022-06-29 DIAGNOSIS — F332 Major depressive disorder, recurrent severe without psychotic features: Secondary | ICD-10-CM | POA: Diagnosis not present

## 2022-06-29 DIAGNOSIS — I10 Essential (primary) hypertension: Secondary | ICD-10-CM | POA: Diagnosis not present

## 2022-07-11 DIAGNOSIS — F4323 Adjustment disorder with mixed anxiety and depressed mood: Secondary | ICD-10-CM | POA: Diagnosis not present

## 2022-07-27 DIAGNOSIS — F4323 Adjustment disorder with mixed anxiety and depressed mood: Secondary | ICD-10-CM | POA: Diagnosis not present

## 2022-07-28 DIAGNOSIS — G4733 Obstructive sleep apnea (adult) (pediatric): Secondary | ICD-10-CM | POA: Diagnosis not present

## 2022-07-28 DIAGNOSIS — G4752 REM sleep behavior disorder: Secondary | ICD-10-CM | POA: Diagnosis not present

## 2022-07-28 DIAGNOSIS — I10 Essential (primary) hypertension: Secondary | ICD-10-CM | POA: Diagnosis not present

## 2022-07-31 DIAGNOSIS — Z1231 Encounter for screening mammogram for malignant neoplasm of breast: Secondary | ICD-10-CM | POA: Diagnosis not present

## 2022-07-31 DIAGNOSIS — E559 Vitamin D deficiency, unspecified: Secondary | ICD-10-CM | POA: Diagnosis not present

## 2022-07-31 DIAGNOSIS — R7303 Prediabetes: Secondary | ICD-10-CM | POA: Diagnosis not present

## 2022-07-31 DIAGNOSIS — Z0000 Encounter for general adult medical examination without abnormal findings: Secondary | ICD-10-CM | POA: Diagnosis not present

## 2022-07-31 DIAGNOSIS — I10 Essential (primary) hypertension: Secondary | ICD-10-CM | POA: Diagnosis not present

## 2022-07-31 DIAGNOSIS — R079 Chest pain, unspecified: Secondary | ICD-10-CM | POA: Diagnosis not present

## 2022-08-08 DIAGNOSIS — F4323 Adjustment disorder with mixed anxiety and depressed mood: Secondary | ICD-10-CM | POA: Diagnosis not present

## 2022-08-31 ENCOUNTER — Ambulatory Visit (HOSPITAL_BASED_OUTPATIENT_CLINIC_OR_DEPARTMENT_OTHER): Payer: BC Managed Care – PPO | Admitting: Family

## 2022-09-07 DIAGNOSIS — F4323 Adjustment disorder with mixed anxiety and depressed mood: Secondary | ICD-10-CM | POA: Diagnosis not present

## 2022-09-29 DIAGNOSIS — F4310 Post-traumatic stress disorder, unspecified: Secondary | ICD-10-CM | POA: Diagnosis not present

## 2022-09-29 DIAGNOSIS — F909 Attention-deficit hyperactivity disorder, unspecified type: Secondary | ICD-10-CM | POA: Diagnosis not present

## 2022-09-29 DIAGNOSIS — F339 Major depressive disorder, recurrent, unspecified: Secondary | ICD-10-CM | POA: Diagnosis not present

## 2022-09-29 DIAGNOSIS — F4323 Adjustment disorder with mixed anxiety and depressed mood: Secondary | ICD-10-CM | POA: Diagnosis not present

## 2022-10-05 ENCOUNTER — Ambulatory Visit (HOSPITAL_BASED_OUTPATIENT_CLINIC_OR_DEPARTMENT_OTHER): Payer: BC Managed Care – PPO | Admitting: Family

## 2022-10-05 DIAGNOSIS — F909 Attention-deficit hyperactivity disorder, unspecified type: Secondary | ICD-10-CM | POA: Diagnosis not present

## 2022-10-05 DIAGNOSIS — F339 Major depressive disorder, recurrent, unspecified: Secondary | ICD-10-CM | POA: Diagnosis not present

## 2022-10-05 DIAGNOSIS — F4001 Agoraphobia with panic disorder: Secondary | ICD-10-CM | POA: Diagnosis not present

## 2022-10-05 DIAGNOSIS — F4310 Post-traumatic stress disorder, unspecified: Secondary | ICD-10-CM | POA: Diagnosis not present

## 2022-11-01 DIAGNOSIS — F339 Major depressive disorder, recurrent, unspecified: Secondary | ICD-10-CM | POA: Diagnosis not present

## 2022-11-01 DIAGNOSIS — F909 Attention-deficit hyperactivity disorder, unspecified type: Secondary | ICD-10-CM | POA: Diagnosis not present

## 2022-11-01 DIAGNOSIS — F4310 Post-traumatic stress disorder, unspecified: Secondary | ICD-10-CM | POA: Diagnosis not present

## 2022-11-01 DIAGNOSIS — F4001 Agoraphobia with panic disorder: Secondary | ICD-10-CM | POA: Diagnosis not present

## 2022-11-03 DIAGNOSIS — F4323 Adjustment disorder with mixed anxiety and depressed mood: Secondary | ICD-10-CM | POA: Diagnosis not present

## 2022-11-08 ENCOUNTER — Ambulatory Visit (HOSPITAL_BASED_OUTPATIENT_CLINIC_OR_DEPARTMENT_OTHER): Payer: BC Managed Care – PPO | Admitting: Cardiovascular Disease

## 2022-11-23 DIAGNOSIS — F4323 Adjustment disorder with mixed anxiety and depressed mood: Secondary | ICD-10-CM | POA: Diagnosis not present

## 2022-11-28 DIAGNOSIS — G4700 Insomnia, unspecified: Secondary | ICD-10-CM | POA: Diagnosis not present

## 2022-11-28 DIAGNOSIS — F4310 Post-traumatic stress disorder, unspecified: Secondary | ICD-10-CM | POA: Diagnosis not present

## 2022-11-28 DIAGNOSIS — F909 Attention-deficit hyperactivity disorder, unspecified type: Secondary | ICD-10-CM | POA: Diagnosis not present

## 2022-11-28 DIAGNOSIS — F339 Major depressive disorder, recurrent, unspecified: Secondary | ICD-10-CM | POA: Diagnosis not present

## 2022-12-04 DIAGNOSIS — I10 Essential (primary) hypertension: Secondary | ICD-10-CM | POA: Diagnosis not present

## 2022-12-04 DIAGNOSIS — F4323 Adjustment disorder with mixed anxiety and depressed mood: Secondary | ICD-10-CM | POA: Diagnosis not present

## 2022-12-04 DIAGNOSIS — F332 Major depressive disorder, recurrent severe without psychotic features: Secondary | ICD-10-CM | POA: Diagnosis not present

## 2022-12-21 DIAGNOSIS — F4323 Adjustment disorder with mixed anxiety and depressed mood: Secondary | ICD-10-CM | POA: Diagnosis not present

## 2022-12-27 DIAGNOSIS — G4700 Insomnia, unspecified: Secondary | ICD-10-CM | POA: Diagnosis not present

## 2022-12-27 DIAGNOSIS — F4001 Agoraphobia with panic disorder: Secondary | ICD-10-CM | POA: Diagnosis not present

## 2022-12-27 DIAGNOSIS — F339 Major depressive disorder, recurrent, unspecified: Secondary | ICD-10-CM | POA: Diagnosis not present

## 2022-12-27 DIAGNOSIS — F4310 Post-traumatic stress disorder, unspecified: Secondary | ICD-10-CM | POA: Diagnosis not present

## 2023-01-04 DIAGNOSIS — F4323 Adjustment disorder with mixed anxiety and depressed mood: Secondary | ICD-10-CM | POA: Diagnosis not present

## 2023-01-11 ENCOUNTER — Ambulatory Visit (HOSPITAL_BASED_OUTPATIENT_CLINIC_OR_DEPARTMENT_OTHER): Payer: BC Managed Care – PPO | Admitting: Cardiovascular Disease

## 2023-01-11 ENCOUNTER — Encounter (HOSPITAL_BASED_OUTPATIENT_CLINIC_OR_DEPARTMENT_OTHER): Payer: Self-pay | Admitting: Cardiovascular Disease

## 2023-01-11 VITALS — BP 136/98 | Ht 69.0 in | Wt 278.0 lb

## 2023-01-11 DIAGNOSIS — I10 Essential (primary) hypertension: Secondary | ICD-10-CM | POA: Diagnosis not present

## 2023-01-11 NOTE — Patient Instructions (Signed)
Medication Instructions:  Your physician recommends that you continue on your current medications as directed. Please refer to the Current Medication list given to you today.   Labwork: NONE   Testing/Procedures: NONE   Follow-Up: 02/15/2023 8:50 AM CAITLIN W NP    You will receive a phone call from the [facility] exercise and nutrition program to schedule an initial assessment.  Special Instructions:   MONITOR YOUR BLOOD PRESSURE AND HEART RATE TWICE A DAY, LOG IN THE BOOK PROVIDED. BRING THE BOOK AND YOUR BLOOD PRESSURE MACHINE TO YOUR FOLLOW UP IN 1 MONTH   DASH Eating Plan DASH stands for "Dietary Approaches to Stop Hypertension." The DASH eating plan is a healthy eating plan that has been shown to reduce high blood pressure (hypertension). It may also reduce your risk for type 2 diabetes, heart disease, and stroke. The DASH eating plan may also help with weight loss. What are tips for following this plan?  General guidelines Avoid eating more than 2,300 mg (milligrams) of salt (sodium) a day. If you have hypertension, you may need to reduce your sodium intake to 1,500 mg a day. Limit alcohol intake to no more than 1 drink a day for nonpregnant women and 2 drinks a day for men. One drink equals 12 oz of beer, 5 oz of wine, or 1 oz of hard liquor. Work with your health care provider to maintain a healthy body weight or to lose weight. Ask what an ideal weight is for you. Get at least 30 minutes of exercise that causes your heart to beat faster (aerobic exercise) most days of the week. Activities may include walking, swimming, or biking. Work with your health care provider or diet and nutrition specialist (dietitian) to adjust your eating plan to your individual calorie needs. Reading food labels  Check food labels for the amount of sodium per serving. Choose foods with less than 5 percent of the Daily Value of sodium. Generally, foods with less than 300 mg of sodium per serving fit into  this eating plan. To find whole grains, look for the word "whole" as the first word in the ingredient list. Shopping Buy products labeled as "low-sodium" or "no salt added." Buy fresh foods. Avoid canned foods and premade or frozen meals. Cooking Avoid adding salt when cooking. Use salt-free seasonings or herbs instead of table salt or sea salt. Check with your health care provider or pharmacist before using salt substitutes. Do not fry foods. Cook foods using healthy methods such as baking, boiling, grilling, and broiling instead. Cook with heart-healthy oils, such as olive, canola, soybean, or sunflower oil. Meal planning Eat a balanced diet that includes: 5 or more servings of fruits and vegetables each day. At each meal, try to fill half of your plate with fruits and vegetables. Up to 6-8 servings of whole grains each day. Less than 6 oz of lean meat, poultry, or fish each day. A 3-oz serving of meat is about the same size as a deck of cards. One egg equals 1 oz. 2 servings of low-fat dairy each day. A serving of nuts, seeds, or beans 5 times each week. Heart-healthy fats. Healthy fats called Omega-3 fatty acids are found in foods such as flaxseeds and coldwater fish, like sardines, salmon, and mackerel. Limit how much you eat of the following: Canned or prepackaged foods. Food that is high in trans fat, such as fried foods. Food that is high in saturated fat, such as fatty meat. Sweets, desserts, sugary drinks, and  other foods with added sugar. Full-fat dairy products. Do not salt foods before eating. Try to eat at least 2 vegetarian meals each week. Eat more home-cooked food and less restaurant, buffet, and fast food. When eating at a restaurant, ask that your food be prepared with less salt or no salt, if possible. What foods are recommended? The items listed may not be a complete list. Talk with your dietitian about what dietary choices are best for you. Grains Whole-grain or  whole-wheat bread. Whole-grain or whole-wheat pasta. Brown rice. [place]. Bulgur. Whole-grain and low-sodium cereals. Pita bread. Low-fat, low-sodium crackers. Whole-wheat flour tortillas. Vegetables Fresh or frozen vegetables (raw, steamed, roasted, or grilled). Low-sodium or reduced-sodium tomato and vegetable juice. Low-sodium or reduced-sodium tomato sauce and tomato paste. Low-sodium or reduced-sodium canned vegetables. Fruits All fresh, dried, or frozen fruit. Canned fruit in natural juice (without added sugar). Meat and other protein foods Skinless chicken or [place]. Ground chicken or [place]. Pork with fat trimmed off. Fish and seafood. Egg whites. Dried beans, peas, or lentils. Unsalted nuts, nut butters, and seeds. Unsalted canned beans. Lean cuts of beef with fat trimmed off. Low-sodium, lean deli meat. Dairy Low-fat (1%) or fat-free (skim) milk. Fat-free, low-fat, or reduced-fat cheeses. Nonfat, low-sodium ricotta or cottage cheese. Low-fat or nonfat yogurt. Low-fat, low-sodium cheese. Fats and oils Soft margarine without trans fats. Vegetable oil. Low-fat, reduced-fat, or light mayonnaise and salad dressings (reduced-sodium). Canola, safflower, olive, soybean, and sunflower oils. Avocado. Seasoning and other foods Herbs. Spices. Seasoning mixes without salt. Unsalted popcorn and pretzels. Fat-free sweets. What foods are not recommended? The items listed may not be a complete list. Talk with your dietitian about what dietary choices are best for you. Grains Baked goods made with fat, such as croissants, muffins, or some breads. Dry pasta or rice meal packs. Vegetables Creamed or fried vegetables. Vegetables in a cheese sauce. Regular canned vegetables (not low-sodium or reduced-sodium). Regular canned tomato sauce and paste (not low-sodium or reduced-sodium). Regular tomato and vegetable juice (not low-sodium or reduced-sodium). Angie Fava. Olives. Fruits Canned fruit in a light  or heavy syrup. Fried fruit. Fruit in cream or butter sauce. Meat and other protein foods Fatty cuts of meat. Ribs. Fried meat. Berniece Salines. Sausage. Bologna and other processed lunch meats. Salami. Fatback. Hotdogs. Bratwurst. Salted nuts and seeds. Canned beans with added salt. Canned or smoked fish. Whole eggs or egg yolks. Chicken or [place] with skin. Dairy Whole or 2% milk, cream, and half-and-half. Whole or full-fat cream cheese. Whole-fat or sweetened yogurt. Full-fat cheese. Nondairy creamers. Whipped toppings. Processed cheese and cheese spreads. Fats and oils Butter. Stick margarine. Lard. Shortening. Ghee. Bacon fat. Tropical oils, such as coconut, palm kernel, or palm oil. Seasoning and other foods Salted popcorn and pretzels. Onion salt, garlic salt, seasoned salt, table salt, and sea salt. Worcestershire sauce. Tartar sauce. Barbecue sauce. Teriyaki sauce. Soy sauce, including reduced-sodium. Steak sauce. Canned and packaged gravies. Fish sauce. Oyster sauce. Cocktail sauce. Horseradish that you find on the shelf. Ketchup. Mustard. Meat flavorings and tenderizers. Bouillon cubes. Hot sauce and Tabasco sauce. Premade or packaged marinades. Premade or packaged taco seasonings. Relishes. Regular salad dressings. Where to find more information: National Heart, Lung, and [place]: https://wilson-eaton.com/ American Heart Association: www.heart.org Summary The DASH eating plan is a healthy eating plan that has been shown to reduce high blood pressure (hypertension). It may also reduce your risk for type 2 diabetes, heart disease, and stroke. With the DASH eating plan, you should limit salt (  sodium) intake to 2,300 mg a day. If you have hypertension, you may need to reduce your sodium intake to 1,500 mg a day. When on the DASH eating plan, aim to eat more fresh fruits and vegetables, whole grains, lean proteins, low-fat dairy, and heart-healthy fats. Work with your health care provider or diet and  nutrition specialist (dietitian) to adjust your eating plan to your individual calorie needs. This information is not intended to replace advice given to you by your health care provider. Make sure you discuss any questions you have with your health care provider. Document Released: 10/26/2011 Document Revised: 10/19/2017 Document Reviewed: 10/30/2016 Elsevier Patient Education  2020 [employer].

## 2023-01-11 NOTE — Progress Notes (Signed)
Advanced Hypertension Clinic Initial Assessment:    Date:  02/16/2023   ID:  Kathy Molina, DOB [birth year], MRN [id number]  PCP:  Medicine, [facility] (Inactive)  Cardiologist:  None  Nephrologist:  Referring MD: Servando Salina, MD   CC: Hypertension  History of Present Illness:    Kathy Molina is a 42 y.o. female with a hx of hypertension, hypothyroidism, diabetes, GERD, fibroids, migraines, and depression, here to establish care in the Advanced Hypertension Clinic. She saw Dr. Garwin Brothers 05/04/2022 and her blood pressure was 125/90. She was previously intolerant of HCTZ. She was referred to the Advanced Hypertension clinic for second opinion on hypertension management. She was seen by Truman Hayward, PA-C 12/04/2022 and her blood pressure was 122/84. Her losartan had previously been increased to 100 mg. She felt like this was working along with trying to eliminate salt from her diet, although she was not monitoring home blood pressures.  Today, she states she was diagnosed with hypertension 01/2022. HCTZ was previously discontinued due to pruritus. She had also developed swelling on amlodipine. Currently she is taking losartan 100 mg. She does not usually monitor her BP at home as she becomes anxious about her readings. In clinic today her blood pressure was initially 162/110, and 136/98 on recheck. She is able to feel her tachycardic heart rate today which she believes is due to her baseline anxiety. She admits to not formally exercising. She does enjoy hiking but does not have a partner for this. Generally she feels well with exercise without anginal symptoms. For diet she both cooks at home and orders out. Lately she is starting to feel more stable in regards to her depression and anxiety, which is helping her normalize her diet. Usually she drinks a 32 oz tea in a day. No smoking history. Alcohol consumption is very rare. She endorses severe snoring and daytime somnolence.  She had one sleep study a long time ago which was inconclusive as she was unable to stay asleep. Of note, she does have plans to become pregnant in the future, and she is pending fibroid surgery and is also here today for preoperative clearance. She denies any palpitations, chest pain, shortness of breath, or peripheral edema. No lightheadedness, headaches, syncope, orthopnea, or PND.  Previous antihypertensives: HCTZ - pruritus Amlodipine - swelling  Past Medical History:  Diagnosis Date   Depression    Diabetes mellitus without complication ([facility])    borderline   Essential hypertension 02/16/2023   Fibroid uterus    GERD (gastroesophageal reflux disease)    Hypothyroidism    was told she had low thyroid level, but no follow up since   Migraine    Motion sickness    Seasonal allergies    Sleep walking     Past Surgical History:  Procedure Laterality Date   MYOMECTOMY N/A 11/30/2015   Procedure: MYOMECTOMY;  Surgeon: Mora Bellman, MD;  Location: [facility];  Service: Gynecology;  Laterality: N/A;    Current Medications: Current Meds  Medication Sig   buPROPion (WELLBUTRIN XL) 300 MG 24 hr tablet Take 300 mg by mouth daily.   cetirizine (ZYRTEC) 10 MG tablet Take 10 mg by mouth daily.   diphenhydrAMINE (SOMINEX) 25 MG tablet Take 25 mg by mouth as needed for allergies. Reported on 01/14/2016   DULoxetine (CYMBALTA) 60 MG capsule Take 60 mg by mouth daily.   EPINEPHrine (EPIPEN 2-PAK) 0.3 mg/0.3 mL IJ SOAJ injection Inject 0.3 mg into the muscle as needed.   fluticasone (  FLONASE) 50 MCG/ACT nasal spray Place 2 sprays into both nostrils as needed for allergies.   gabapentin (NEURONTIN) 100 MG capsule Take 100 mg by mouth as needed.   hydrOXYzine (VISTARIL) 25 MG capsule Take 25 mg by mouth as needed for itching.   losartan (COZAAR) 100 MG tablet Take 100 mg by mouth daily.   MedroxyPROGESTERone Acetate (DEPO-PROVERA IM) Inject 1 Dose into the muscle every 3 (three) months. Reported on  01/14/2016   omeprazole (PRILOSEC) 10 MG capsule Take 10 mg by mouth daily.     Allergies:   Augmentin [amoxicillin-pot clavulanate], Other, Fish oil, and Shellfish allergy   Social History   Socioeconomic History   Marital status: Single    Spouse name: Not on file   Number of children: Not on file   Years of education: Not on file   Highest education level: Not on file  Occupational History   Not on file  Tobacco Use   Smoking status: Never   Smokeless tobacco: Never  Substance and Sexual Activity   Alcohol use: Yes    Comment: occassionally   Drug use: No   Sexual activity: Yes    Birth control/protection: Injection  Other Topics Concern   Not on file  Social History Narrative   Not on file   Social Determinants of Health   Financial Resource Strain: Not on file  Food Insecurity: No Food Insecurity (01/11/2023)   Hunger Vital Sign    Worried About Running Out of Food in the Last Year: Never true    Ran Out of Food in the Last Year: Never true  Transportation Needs: No Transportation Needs (01/11/2023)   PRAPARE - Transportation    Lack of Transportation (Medical): No    Lack of Transportation (Non-Medical): No  Physical Activity: Inactive (01/11/2023)   Exercise Vital Sign    Days of Exercise per Week: 0 days    Minutes of Exercise per Session: 0 min  Stress: Not on file  Social Connections: Not on file     Family History: The patient's family history includes Heart attack in her maternal aunt; Hypertension in her father and maternal aunt; Stroke in her maternal aunt.  ROS:   Please see the history of present illness.    (+) Snoring (+) Daytime somnolence All other systems reviewed and are negative.  EKGs/Labs/Other Studies Reviewed:    Exercise Stress Test 07/31/2022  ([facility]): Patient exercised for 4 minutes on a standard protocol.  Patient achieved a workload of 7.2 METS, reached 96% of maximal  age-predicted heart rate.  Patient had normal hemodynamic  response to exercise.  Baseline normal EKG, mild nonspecific ST changes with exercise.   Echocardiogram  07/31/2022  ([facility]): Impression: Left Ventricle: Left ventricle size is normal.    Left Ventricle: There is moderate concentric hypertrophy.    Left Ventricle: Systolic function is normal. EF: 60-65%.    Right Ventricle: Right ventricle size is normal.    Right Ventricle: Systolic function is normal.    EKG:  EKG is personally reviewed. 01/11/2023: Sinus tachycardia. Rate 103 bpm. LVH.  Recent Labs: No results found for requested labs within last 365 days.   Recent Lipid Panel No results found for: "CHOL", "TRIG", "HDL", "CHOLHDL", "VLDL", "LDLCALC", "LDLDIRECT"  Physical Exam:    VS:  BP (!) 136/98 (BP Location: Left Arm, Patient Position: Sitting, Cuff Size: Large)   Ht 5\' 9"  (1.753 m)   Wt 278 lb (126.1 kg)   BMI 41.05 kg/m  , BMI  Body mass index is 41.05 kg/m. GENERAL:  Well appearing HEENT: Pupils equal round and reactive, fundi not visualized, oral mucosa unremarkable NECK:  No jugular venous distention, waveform within normal limits, carotid upstroke brisk and symmetric, no bruits, no thyromegaly LUNGS:  Clear to auscultation bilaterally HEART: Tachycardic. PMI not displaced or sustained,S1 and S2 within normal limits, no S3, no S4, no clicks, no rubs, no murmurs ABD:  Flat, positive bowel sounds normal in frequency in pitch, no bruits, no rebound, no guarding, no midline pulsatile mass, no hepatomegaly, no splenomegaly EXT:  2 plus pulses throughout, no edema, no cyanosis, no clubbing SKIN:  No rashes, no nodules NEURO:  Cranial nerves II through XII grossly intact, motor grossly intact throughout PSYCH:  Cognitively intact, oriented to person place and time   ASSESSMENT/PLAN:    Essential hypertension BP uncontrolled.  She has not tolerated HCTZ or amlodipine.  She doesn't monitor her BP due to anxiety.  Anxiety does seem to be contributing her to B at home.   She snores and has daytime somnolence.  Check Itamar sleep study.  We will refer her to the PREP program and start exercising at least 150 minutes weekly.  Limit caffeine intake.  It is difficult to determine what to do about her BP without more data.  She will track her BP twice daily for a month and bring to follow up.  Continue losartan for now.  Potassium has been low in the past.  Continue checking renin/aldosterone if BP remains uncontrolled.   Screening for Secondary Hypertension:     01/11/2023    3:12 PM  Causes  Drugs/Herbals Screened     - Comments tries to limit salt.  3 teas daily.  No tob.  EtOH rare  Sleep Apnea Screened  Coarctation of the Aorta Screened     - Comments BP symmetric  Compliance Screened    Relevant Labs/Studies:    Latest Ref Rng & Units 05/06/2017    8:31 PM  Basic Labs  Sodium 135 - 145 mmol/L 135   Potassium 3.5 - 5.1 mmol/L 2.8   Creatinine 0.44 - 1.00 mg/dL 1.00      Disposition:    FU with APP/PharmD in 1 month for the next 3 months.   FU with Josiyah Tozzi C. Oval Linsey, MD, [facility] in 4 months.  Medication Adjustments/Labs and Tests Ordered: Current medicines are reviewed at length with the patient today.  Concerns regarding medicines are outlined above.   Orders Placed This Encounter  Procedures   Amb Referral To Provider Referral Exercise Program (P.R.E.P)   EKG 12-Lead   No orders of the defined types were placed in this encounter.  I,Mathew Stumpf,acting as a Education administrator for Skeet Latch, MD.,have documented all relevant documentation on the behalf of Skeet Latch, MD,as directed by  Skeet Latch, MD while in the presence of Skeet Latch, MD.  I, [place] Oval Linsey, MD have reviewed all documentation for this visit.  The documentation of the exam, diagnosis, procedures, and orders on 02/16/2023 are all accurate and complete.   Signed, Skeet Latch, MD  02/16/2023 1:55 PM    [place]

## 2023-02-15 ENCOUNTER — Ambulatory Visit (HOSPITAL_BASED_OUTPATIENT_CLINIC_OR_DEPARTMENT_OTHER): Payer: BC Managed Care – PPO | Admitting: Family

## 2023-02-16 ENCOUNTER — Encounter (HOSPITAL_BASED_OUTPATIENT_CLINIC_OR_DEPARTMENT_OTHER): Payer: Self-pay | Admitting: Cardiovascular Disease

## 2023-02-16 DIAGNOSIS — F339 Major depressive disorder, recurrent, unspecified: Secondary | ICD-10-CM | POA: Diagnosis not present

## 2023-02-16 DIAGNOSIS — G4700 Insomnia, unspecified: Secondary | ICD-10-CM | POA: Diagnosis not present

## 2023-02-16 DIAGNOSIS — F909 Attention-deficit hyperactivity disorder, unspecified type: Secondary | ICD-10-CM | POA: Diagnosis not present

## 2023-02-16 DIAGNOSIS — I10 Essential (primary) hypertension: Secondary | ICD-10-CM | POA: Insufficient documentation

## 2023-02-16 HISTORY — DX: Essential (primary) hypertension: I10

## 2023-02-16 NOTE — Assessment & Plan Note (Addendum)
BP uncontrolled.  She has not tolerated HCTZ or amlodipine.  She doesn't monitor her BP due to anxiety.  Anxiety does seem to be contributing her to B at home.  She snores and has daytime somnolence.  Check Itamar sleep study.  We will refer her to the PREP program and start exercising at least 150 minutes weekly.  Limit caffeine intake.  It is difficult to determine what to do about her BP without more data.  She will track her BP twice daily for a month and bring to follow up.  Continue losartan for now.  Potassium has been low in the past.  Continue checking renin/aldosterone if BP remains uncontrolled.

## 2023-02-20 DIAGNOSIS — F4323 Adjustment disorder with mixed anxiety and depressed mood: Secondary | ICD-10-CM | POA: Diagnosis not present

## 2023-03-20 DIAGNOSIS — F4323 Adjustment disorder with mixed anxiety and depressed mood: Secondary | ICD-10-CM | POA: Diagnosis not present

## 2023-03-22 ENCOUNTER — Ambulatory Visit (HOSPITAL_BASED_OUTPATIENT_CLINIC_OR_DEPARTMENT_OTHER): Payer: BC Managed Care – PPO | Admitting: Family

## 2023-04-17 DIAGNOSIS — F4323 Adjustment disorder with mixed anxiety and depressed mood: Secondary | ICD-10-CM | POA: Diagnosis not present

## 2023-05-15 DIAGNOSIS — F4323 Adjustment disorder with mixed anxiety and depressed mood: Secondary | ICD-10-CM | POA: Diagnosis not present

## 2023-05-15 NOTE — Progress Notes (Unsigned)
Advanced Hypertension Clinic Assessment:    Date:  05/16/2023   ID:  Kathy Molina, DOB [birth year], MRN [id number]  PCP:  Medicine, [facility] (Inactive)  Cardiologist:  None  Nephrologist:  Referring MD: No ref. provider found   CC: Hypertension  History of Present Illness:    Kathy Molina is a 42 y.o. female with a hx of hypertension, hypothyroidism, diabetes, GERD, fibroids, migraines, depression here to follow up in the Advanced Hypertension Clinic.   Initial diagnosis of hypertension 01/2022. She saw Dr. Cherly Hensen OB/GYN 05/04/22 with BP 125/90.  Prior intolerance hydrochlorothiazide with pruritus. Amlodipine previously stopped due to swelling. Losartan previously increased to 100 mg by primary care provider.  At initial Advanced Hypertension Clinic visit 01/11/23 BP 162/110 ? 136/98 without intervention. She did note tachycardia which she attributed to anxiety. She was pending fibroid surgery as well as planning for future pregnancy.  She was agreeable to check BP twice a day for a month and bring to follow-up.  Presents today for follow up independently. Excited for a trip to the beach this weekend. Since last seen notes she has been doing a lot of travel back and forth to [place] to help her parents. Planning to move back to [place] in September but will likely keep medical care in [place] for some time. A couple weeks ago checked her blood pressure and it was good but has not been checking routinely. Has been walking for exercise. Next appointment soon with Dr. Cherly Hensen to discuss fibroid surgery. Reports no shortness of breath nor dyspnea on exertion. Reports no chest pain, pressure, or tightness. No edema, orthopnea, PND. Reports no palpitations.    Previous antihypertensives:   Past Medical History:  Diagnosis Date   Depression    Diabetes mellitus without complication ([facility])    borderline   Essential hypertension 02/16/2023   Fibroid uterus    GERD  (gastroesophageal reflux disease)    Hypothyroidism    was told she had low thyroid level, but no follow up since   Migraine    Motion sickness    Seasonal allergies    Sleep walking     Past Surgical History:  Procedure Laterality Date   MYOMECTOMY N/A 11/30/2015   Procedure: MYOMECTOMY;  Surgeon: Catalina Antigua, MD;  Location: [facility];  Service: Gynecology;  Laterality: N/A;    Current Medications: Current Meds  Medication Sig   buPROPion (WELLBUTRIN XL) 300 MG 24 hr tablet Take 300 mg by mouth daily.   cetirizine (ZYRTEC) 10 MG tablet Take 10 mg by mouth daily.   diphenhydrAMINE (SOMINEX) 25 MG tablet Take 25 mg by mouth as needed for allergies. Reported on 01/14/2016   DULoxetine (CYMBALTA) 60 MG capsule Take 60 mg by mouth daily.   EPINEPHrine (EPIPEN 2-PAK) 0.3 mg/0.3 mL IJ SOAJ injection Inject 0.3 mg into the muscle as needed.   fluticasone (FLONASE) 50 MCG/ACT nasal spray Place 2 sprays into both nostrils as needed for allergies.   gabapentin (NEURONTIN) 100 MG capsule Take 100 mg by mouth as needed.   hydrOXYzine (VISTARIL) 25 MG capsule Take 25 mg by mouth as needed for itching.   losartan (COZAAR) 100 MG tablet Take 100 mg by mouth daily.   NIFEdipine (PROCARDIA-XL/NIFEDICAL-XL) 30 MG 24 hr tablet Take 1 tablet (30 mg total) by mouth daily.   omeprazole (PRILOSEC) 10 MG capsule Take 10 mg by mouth daily.     Allergies:   Augmentin [amoxicillin-pot clavulanate], Other, Fish oil, and Shellfish allergy  Social History   Socioeconomic History   Marital status: Single    Spouse name: Not on file   Number of children: Not on file   Years of education: Not on file   Highest education level: Not on file  Occupational History   Not on file  Tobacco Use   Smoking status: Never   Smokeless tobacco: Never  Substance and Sexual Activity   Alcohol use: Yes    Comment: occassionally   Drug use: No   Sexual activity: Yes    Birth control/protection: Injection  Other  Topics Concern   Not on file  Social History Narrative   Not on file   Social Determinants of Health   Financial Resource Strain: Not on file  Food Insecurity: No Food Insecurity (01/11/2023)   Hunger Vital Sign    Worried About Running Out of Food in the Last Year: Never true    Ran Out of Food in the Last Year: Never true  Transportation Needs: No Transportation Needs (01/11/2023)   PRAPARE - Transportation    Lack of Transportation (Medical): No    Lack of Transportation (Non-Medical): No  Physical Activity: Inactive (01/11/2023)   Exercise Vital Sign    Days of Exercise per Week: 0 days    Minutes of Exercise per Session: 0 min  Stress: Not on file  Social Connections: Not on file     Family History: The patient's family history includes Heart attack in her maternal aunt; Hypertension in her father and maternal aunt; Stroke in her maternal aunt.  ROS:   Please see the history of present illness.     All other systems reviewed and are negative.  EKGs/Labs/Other Studies Reviewed:    EKG:  EKG is not ordered today.    Recent Labs: No results found for requested labs within last 365 days.   Recent Lipid Panel No results found for: "CHOL", "TRIG", "HDL", "CHOLHDL", "VLDL", "LDLCALC", "LDLDIRECT"  Physical Exam:   VS:  BP 138/86   Pulse 99   Ht 5\' 9"  (1.753 m)   Wt 281 lb (127.5 kg)   BMI 41.50 kg/m  , BMI Body mass index is 41.5 kg/m. GENERAL:  Well appearing HEENT: Pupils equal round and reactive, fundi not visualized, oral mucosa unremarkable NECK:  No jugular venous distention, waveform within normal limits, carotid upstroke brisk and symmetric, no bruits, no thyromegaly LYMPHATICS:  No cervical adenopathy LUNGS:  Clear to auscultation bilaterally HEART:  RRR.  PMI not displaced or sustained,S1 and S2 within normal limits, no S3, no S4, no clicks, no rubs, no murmurs ABD:  Flat, positive bowel sounds normal in frequency in pitch, no bruits, no rebound, no  guarding, no midline pulsatile mass, no hepatomegaly, no splenomegaly EXT:  2 plus pulses throughout, no edema, no cyanosis no clubbing SKIN:  No rashes no nodules NEURO:  Cranial nerves II through XII grossly intact, motor grossly intact throughout PSYCH:  Cognitively intact, oriented to person place and time  ASSESSMENT/PLAN:    HTN - BP not at goal <130/80 on Losartan 100mg  every day. Planning to pursue future pregnancy. Will Rx Nifedipine 30mg  every day with goal to gradually down titrate Losartan and up titrate Nifedipine. Check in via MyChart in 2 weeks. Discussed to monitor BP at home at least 2 hours after medications and sitting for 5-10 minutes. Recommend aiming for 150 minutes of moderate intensity activity per week and following a heart healthy diet.  Consider renin aldosterone level at follow up when  BP better controlled as will have to hold ARB prior to testing.   Snores / Sleep disordered breathing - STOP Bang 4. Notes snoring and daytime somnolence. Itamar home sleep study provided in clinic.   Preop clearance - Pending fibroid surgery with Dr. Cherly Hensen. Exercise tolerance > 4 METS. Per AHA/ACC guidelines, she is deemed acceptable risk for the planned procedure without additional cardiovascular testing.   Screening for Secondary Hypertension:     01/11/2023    3:12 PM  Causes  Drugs/Herbals Screened     - Comments tries to limit salt.  3 teas daily.  No tob.  EtOH rare  Sleep Apnea Screened  Coarctation of the Aorta Screened     - Comments BP symmetric  Compliance Screened    Relevant Labs/Studies:    Latest Ref Rng & Units 05/06/2017    8:31 PM  Basic Labs  Sodium 135 - 145 mmol/L 135   Potassium 3.5 - 5.1 mmol/L 2.8   Creatinine 0.44 - 1.00 mg/dL 1.02                     Disposition:    FU with MD/PharmD in 2 months    Medication Adjustments/Labs and Tests Ordered: Current medicines are reviewed at length with the patient today.  Concerns regarding  medicines are outlined above.  Orders Placed This Encounter  Procedures   Itamar Sleep Study   Meds ordered this encounter  Medications   NIFEdipine (PROCARDIA-XL/NIFEDICAL-XL) 30 MG 24 hr tablet    Sig: Take 1 tablet (30 mg total) by mouth daily.    Dispense:  30 tablet    Refill:  2    Order Specific Question:   Supervising Provider    Answer:   Jodelle Red [[id number]]     Signed, Alver Sorrow, NP  05/16/2023 10:02 AM    [facility]

## 2023-05-16 ENCOUNTER — Encounter (HOSPITAL_BASED_OUTPATIENT_CLINIC_OR_DEPARTMENT_OTHER): Payer: Self-pay | Admitting: Family

## 2023-05-16 ENCOUNTER — Ambulatory Visit (INDEPENDENT_AMBULATORY_CARE_PROVIDER_SITE_OTHER): Payer: BC Managed Care – PPO | Admitting: Family

## 2023-05-16 VITALS — BP 138/86 | HR 99 | Ht 69.0 in | Wt 281.0 lb

## 2023-05-16 DIAGNOSIS — Z01810 Encounter for preprocedural cardiovascular examination: Secondary | ICD-10-CM | POA: Diagnosis not present

## 2023-05-16 DIAGNOSIS — I10 Essential (primary) hypertension: Secondary | ICD-10-CM

## 2023-05-16 DIAGNOSIS — F339 Major depressive disorder, recurrent, unspecified: Secondary | ICD-10-CM | POA: Diagnosis not present

## 2023-05-16 DIAGNOSIS — G4730 Sleep apnea, unspecified: Secondary | ICD-10-CM | POA: Diagnosis not present

## 2023-05-16 DIAGNOSIS — R0683 Snoring: Secondary | ICD-10-CM | POA: Diagnosis not present

## 2023-05-16 DIAGNOSIS — F4310 Post-traumatic stress disorder, unspecified: Secondary | ICD-10-CM | POA: Diagnosis not present

## 2023-05-16 DIAGNOSIS — G4700 Insomnia, unspecified: Secondary | ICD-10-CM | POA: Diagnosis not present

## 2023-05-16 MED ORDER — NIFEDIPINE ER OSMOTIC RELEASE 30 MG PO TB24: 30.0000 mg | ORAL_TABLET | Freq: Every day | ORAL | 2 refills | Status: DC

## 2023-05-16 NOTE — Patient Instructions (Addendum)
Medication Instructions:  Your physician has recommended you make the following change in your medication:   Start: Nifedipine 30mg  daily   Testing/Procedures: WatchPAT?  Is a FDA cleared portable home sleep study test that uses a watch and 3 points of contact to monitor 7 different channels, including your heart rate, oxygen saturations, body position, snoring, and chest motion.  The study is easy to use from the comfort of your own home and accurately detect sleep apnea.  Before bed, you attach the chest sensor, attached the sleep apnea bracelet to your nondominant hand, and attach the finger probe.  After the study, the raw data is downloaded from the watch and scored for apnea events.   For more information: [email or web address]  Patient Testing Instructions:  Do not put battery into the device until bedtime when you are ready to begin the test. Please call the support number if you need assistance after following the instructions below: 24 hour support line- [phone] or ITAMAR support at [phone] (option 2)  Download the IntelWatchPAT One" app through the google play store or App Store  Be sure to turn on or enable access to bluetooth in settlings on your smartphone/ device  Make sure no other bluetooth devices are on and within the vicinity of your smartphone/ device and WatchPAT watch during testing.  Make sure to leave your smart phone/ device plugged in and charging all night.  When ready for bed:  Follow the instructions step by step in the WatchPAT One App to activate the testing device. For additional instructions, including video instruction, visit the WatchPAT One video on Youtube. You can search for WatchPat One within Youtube (video is 4 minutes and 18 seconds) or enter: https://youtube/watch?[email or web address]_vbiwxE Please note: You will be prompted to enter a Pin to connect via bluetooth when starting the test. The PIN will be assigned to you when you receive  the test.  The device is disposable, but it recommended that you retain the device until you receive a call letting you know the study has been received and the results have been interpreted.  We will let you know if the study did not transmit to [place] properly after the test is completed. You do not need to call us to confirm the receipt of the test.  Please complete the test within 48 hours of receiving PIN.   Frequently Asked Questions:  What is Watch Dennie Bible one?  A single use fully disposable home sleep apnea testing device and will not need to be returned after completion.  What are the requirements to use WatchPAT one?  The be able to have a successful watchpat one sleep study, you should have your Watch pat one device, your smart phone, watch pat one app, your PIN number and Internet access What type of phone do I need?  You should have a smart phone that uses Android 5.1 and above or any Iphone with IOS 10 and above How can I download the WatchPAT one app?  Based on your device type search for WatchPAT one app either in google play for android devices or APP store for Iphone's Where will I get my PIN for the study?  Your PIN will be provided by your physician's office. It is used for authentication and if you lose/forget your PIN, please reach out to your providers office.  I do not have Internet at home. Can I do WatchPAT one study?  WatchPAT One needs Internet connection throughout the night to be able to transmit  the sleep data. You can use your home/local internet or your cellular's data package. However, it is always recommended to use home/local Internet. It is estimated that between 20MB-30MB will be used with each study.However, the application will be looking for space in the phone to start the study.  What happens if I lose internet or bluetooth connection?  During the internet disconnection, your phone will not be able to transmit the sleep data. All the data, will be stored in  your phone. As soon as the internet connection is back on, the phone will being sending the sleep data. During the bluetooth disconnection, WatchPAT one will not be able to to send the sleep data to your phone. Data will be kept in the [facility] one until two devices have bluetooth connection back on. As soon as the connection is back on, WatchPAT one will send the sleep data to the phone.  How long do I need to wear the WatchPAT one?  After you start the study, you should wear the device at least 6 hours.  How far should I keep my phone from the device?  During the night, your phone should be within 15 feet.  What happens if I leave the room for restroom or other reasons?  Leaving the room for any reason will not cause any problem. As soon as your get back to the room, both devices will reconnect and will continue to send the sleep data. Can I use my phone during the sleep study?  Yes, you can use your phone as usual during the study. But it is recommended to put your watchpat one on when you are ready to go to bed.  How will I get my study results?  A soon as you completed your study, your sleep data will be sent to the provider. They will then share the results with you when they are ready.    Follow-Up: 8/22 at 8:25am with Gillian Shields, NP

## 2023-05-29 DIAGNOSIS — F4323 Adjustment disorder with mixed anxiety and depressed mood: Secondary | ICD-10-CM | POA: Diagnosis not present

## 2023-05-31 ENCOUNTER — Encounter (HOSPITAL_BASED_OUTPATIENT_CLINIC_OR_DEPARTMENT_OTHER): Payer: Self-pay

## 2023-05-31 DIAGNOSIS — J029 Acute pharyngitis, unspecified: Secondary | ICD-10-CM | POA: Diagnosis not present

## 2023-05-31 DIAGNOSIS — I10 Essential (primary) hypertension: Secondary | ICD-10-CM | POA: Diagnosis not present

## 2023-06-03 DIAGNOSIS — J029 Acute pharyngitis, unspecified: Secondary | ICD-10-CM | POA: Diagnosis not present

## 2023-06-05 ENCOUNTER — Encounter (HOSPITAL_BASED_OUTPATIENT_CLINIC_OR_DEPARTMENT_OTHER): Payer: Self-pay

## 2023-06-05 NOTE — Telephone Encounter (Signed)
Not likely side effects that I can see, just wanted to confirm with you all.

## 2023-06-12 ENCOUNTER — Telehealth (HOSPITAL_BASED_OUTPATIENT_CLINIC_OR_DEPARTMENT_OTHER): Payer: Self-pay

## 2023-06-12 NOTE — Telephone Encounter (Signed)
06/12/2023  Per [facility] Prior authorization is not required for Sleep study.  Call to patient PIN provided. Jim Like MHA RN CCM

## 2023-06-19 DIAGNOSIS — F4323 Adjustment disorder with mixed anxiety and depressed mood: Secondary | ICD-10-CM | POA: Diagnosis not present

## 2023-06-28 DIAGNOSIS — G4700 Insomnia, unspecified: Secondary | ICD-10-CM | POA: Diagnosis not present

## 2023-06-28 DIAGNOSIS — F4310 Post-traumatic stress disorder, unspecified: Secondary | ICD-10-CM | POA: Diagnosis not present

## 2023-06-28 DIAGNOSIS — F339 Major depressive disorder, recurrent, unspecified: Secondary | ICD-10-CM | POA: Diagnosis not present

## 2023-06-28 DIAGNOSIS — F4001 Agoraphobia with panic disorder: Secondary | ICD-10-CM | POA: Diagnosis not present

## 2023-07-04 DIAGNOSIS — Z01419 Encounter for gynecological examination (general) (routine) without abnormal findings: Secondary | ICD-10-CM | POA: Diagnosis not present

## 2023-07-12 ENCOUNTER — Encounter (HOSPITAL_BASED_OUTPATIENT_CLINIC_OR_DEPARTMENT_OTHER): Payer: Self-pay | Admitting: Family

## 2023-07-12 ENCOUNTER — Ambulatory Visit (INDEPENDENT_AMBULATORY_CARE_PROVIDER_SITE_OTHER): Payer: BC Managed Care – PPO | Admitting: Family

## 2023-07-12 VITALS — BP 138/88 | HR 106 | Ht 69.0 in | Wt 276.0 lb

## 2023-07-12 DIAGNOSIS — F4323 Adjustment disorder with mixed anxiety and depressed mood: Secondary | ICD-10-CM | POA: Diagnosis not present

## 2023-07-12 DIAGNOSIS — G4730 Sleep apnea, unspecified: Secondary | ICD-10-CM | POA: Diagnosis not present

## 2023-07-12 DIAGNOSIS — I10 Essential (primary) hypertension: Secondary | ICD-10-CM

## 2023-07-12 DIAGNOSIS — R0683 Snoring: Secondary | ICD-10-CM | POA: Diagnosis not present

## 2023-07-12 MED ORDER — NIFEDIPINE ER OSMOTIC RELEASE 30 MG PO TB24: 30.0000 mg | ORAL_TABLET | Freq: Every day | ORAL | 2 refills | Status: DC

## 2023-07-12 NOTE — Patient Instructions (Addendum)
Medication Instructions:  Your physician recommends that you continue on your current medications as directed. Please refer to the Current Medication list given to you today.  Follow-Up: Follow up as scheduled    Special Instructions:   Check BP at home at least 3 times per week at least one hour after you take your medications. We will send you a MyChart message in 2 weeks to check on blood pressure.   Your BP cuff 140/102  Manual cuff: 134/84

## 2023-07-12 NOTE — Progress Notes (Signed)
Advanced Hypertension Clinic Assessment:    Date:  07/12/2023   ID:  Kathy Molina, DOB [birth year], MRN [id number]  PCP:  Medicine, [facility] (Inactive)  Cardiologist:  None  Nephrologist:  Referring MD: No ref. provider found   CC: Hypertension  History of Present Illness:    Kathy Molina is a 42 y.o. female with a hx of hypertension, hypothyroidism, diabetes, GERD, fibroids, migraines, depression here to follow up in the Advanced Hypertension Clinic.   Initial diagnosis of hypertension 01/2022. She saw Dr. Cherly Hensen OB/GYN 05/04/22 with BP 125/90.  Prior intolerance hydrochlorothiazide with pruritus. Amlodipine previously stopped due to swelling. Losartan previously increased to 100 mg by primary care provider.  At initial Advanced Hypertension Clinic visit 01/11/23 BP 162/110 ? 136/98 without intervention. She did note tachycardia which she attributed to anxiety. She was pending fibroid surgery as well as planning for future pregnancy.    At follow-up 04/2023 she was started on nifedipine and losartan dose reduced with goal to gradually transition to nifedipine to pursue future pregnancy.  Home sleep study provided in clinic.  Presents today for follow up independently. Planning to move back to [place] in September to help her parents but will likely keep medical care in [place] for some time. Has not yet completed sleep study but plans to do so.  If possible would prefer dental device.  She LASIK losartan last week and has been taking solely nifedipine.  BP at home 140/102, 134/84.    Previous antihypertensives: Losartan- discontinued due to goal to pursue pregnancy  Past Medical History:  Diagnosis Date   Depression    Diabetes mellitus without complication (HCC)    borderline   Essential hypertension 02/16/2023   Fibroid uterus    GERD (gastroesophageal reflux disease)    Hypothyroidism    was told she had low thyroid level, but no follow up since    Migraine    Motion sickness    Seasonal allergies    Sleep walking     Past Surgical History:  Procedure Laterality Date   MYOMECTOMY N/A 11/30/2015   Procedure: MYOMECTOMY;  Surgeon: Catalina Antigua, MD;  Location: [facility];  Service: Gynecology;  Laterality: N/A;    Current Medications: Current Meds  Medication Sig   buPROPion (WELLBUTRIN XL) 300 MG 24 hr tablet Take 300 mg by mouth daily.   cetirizine (ZYRTEC) 10 MG tablet Take 10 mg by mouth daily.   diphenhydrAMINE (SOMINEX) 25 MG tablet Take 25 mg by mouth as needed for allergies. Reported on 01/14/2016   DULoxetine (CYMBALTA) 30 MG capsule Take 90 mg by mouth daily.   EPINEPHrine (EPIPEN 2-PAK) 0.3 mg/0.3 mL IJ SOAJ injection Inject 0.3 mg into the muscle as needed.   fluticasone (FLONASE) 50 MCG/ACT nasal spray Place 2 sprays into both nostrils as needed for allergies.   gabapentin (NEURONTIN) 100 MG capsule Take 100 mg by mouth as needed.   hydrOXYzine (VISTARIL) 25 MG capsule Take 25 mg by mouth as needed for itching.   NIFEdipine (PROCARDIA-XL/NIFEDICAL-XL) 30 MG 24 hr tablet Take 1 tablet (30 mg total) by mouth daily.   omeprazole (PRILOSEC) 10 MG capsule Take 10 mg by mouth daily.     Allergies:   Augmentin [amoxicillin-pot clavulanate], Other, Fish oil, and Shellfish allergy   Social History   Socioeconomic History   Marital status: Single    Spouse name: Not on file   Number of children: Not on file   Years of education: Not on file  Highest education level: Not on file  Occupational History   Not on file  Tobacco Use   Smoking status: Never   Smokeless tobacco: Never  Substance and Sexual Activity   Alcohol use: Yes    Comment: occassionally   Drug use: No   Sexual activity: Yes    Birth control/protection: Injection  Other Topics Concern   Not on file  Social History Narrative   Not on file   Social Determinants of Health   Financial Resource Strain: Low Risk  (05/31/2023)   Received [facility]   Overall Financial Resource Strain (CARDIA)    Difficulty of Paying Living Expenses: Not hard at all  Food Insecurity: No Food Insecurity (05/31/2023)   Received from [facility]   Hunger Vital Sign    Worried About Running Out of Food in the Last Year: Never true    Ran Out of Food in the Last Year: Never true  Transportation Needs: No Transportation Needs (05/31/2023)   Received from [facility] - Transportation    Lack of Transportation (Medical): No    Lack of Transportation (Non-Medical): No  Physical Activity: Unknown (05/31/2023)   Received from [facility]   Exercise Vital Sign    Days of Exercise per Week: 0 days    Minutes of Exercise per Session: Not on file  Stress: Stress Concern Present (05/31/2023)   Received from [facility] - Occupational Stress Questionnaire    Feeling of Stress : To some extent  Social Connections: Moderately Integrated (05/31/2023)   Received from [facility]   Social Network    How would you rate your social network (family, work, friends)?: Adequate participation with social networks     Family History: The patient's family history includes Heart attack in her maternal aunt; Hypertension in her father and maternal aunt; Stroke in her maternal aunt.  ROS:   Please see the history of present illness.     All other systems reviewed and are negative.  EKGs/Labs/Other Studies Reviewed:    EKG:  EKG is not ordered today.    Recent Labs: No results found for requested labs within last 365 days.   Recent Lipid Panel No results found for: "CHOL", "TRIG", "HDL", "CHOLHDL", "VLDL", "LDLCALC", "LDLDIRECT"  Physical Exam:   VS:  BP 138/88   Pulse (!) 106   Ht 5\' 9"  (1.753 m)   Wt 276 lb (125.2 kg)   BMI 40.76 kg/m  , BMI Body mass index is 40.76 kg/m. GENERAL:  Well appearing HEENT: Pupils equal round and reactive, fundi not visualized, oral mucosa unremarkable NECK:  No  jugular venous distention, waveform within normal limits, carotid upstroke brisk and symmetric, no bruits, no thyromegaly LYMPHATICS:  No cervical adenopathy LUNGS:  Clear to auscultation bilaterally HEART:  RRR.  PMI not displaced or sustained,S1 and S2 within normal limits, no S3, no S4, no clicks, no rubs, no murmurs ABD:  Flat, positive bowel sounds normal in frequency in pitch, no bruits, no rebound, no guarding, no midline pulsatile mass, no hepatomegaly, no splenomegaly EXT:  2 plus pulses throughout, no edema, no cyanosis no clubbing SKIN:  No rashes no nodules NEURO:  Cranial nerves II through XII grossly intact, motor grossly intact throughout PSYCH:  Cognitively intact, oriented to person place and time  ASSESSMENT/PLAN:    HTN -BP 138/88 with repeat 134/84 without intervention.  Home cuff found to read 6 points higher systolic and  20 points high diastolic.  Planning to pursue future pregnancy.  As such, remain off losartan.  Continue nifedipine 30 mg daily.  Check in via MyChart message in a few weeks and if BP not at goal at home plan to increase nifedipine to 60 mg daily. Discussed to monitor BP at home at least 2 hours after medications and sitting for 5-10 minutes. Recommend aiming for 150 minutes of moderate intensity activity per week and following a heart healthy diet.    Snores / Sleep disordered breathing - STOP Bang 4. Notes snoring and daytime somnolence. [place] home sleep study provided in clinic previously, plans to complete. If possible, would prefer dental device instead of CPAP.   Screening for Secondary Hypertension:     01/11/2023    3:12 PM  Causes  Drugs/Herbals Screened     - Comments tries to limit salt.  3 teas daily.  No tob.  EtOH rare  Sleep Apnea Screened  Coarctation of the Aorta Screened     - Comments BP symmetric  Compliance Screened    Relevant Labs/Studies:    Latest Ref Rng & Units 05/06/2017    8:31 PM  Basic Labs  Sodium 135 - 145 mmol/L  135   Potassium 3.5 - 5.1 mmol/L 2.8   Creatinine 0.44 - 1.00 mg/dL 2.13                     Disposition:    FU with MD/PharmD in 4 months    Medication Adjustments/Labs and Tests Ordered: Current medicines are reviewed at length with the patient today.  Concerns regarding medicines are outlined above.  No orders of the defined types were placed in this encounter.  No orders of the defined types were placed in this encounter.    Signed, Alver Sorrow, NP  07/12/2023 8:36 AM    [facility]

## 2023-07-19 ENCOUNTER — Encounter (HOSPITAL_BASED_OUTPATIENT_CLINIC_OR_DEPARTMENT_OTHER): Payer: Self-pay

## 2023-07-26 ENCOUNTER — Encounter (HOSPITAL_BASED_OUTPATIENT_CLINIC_OR_DEPARTMENT_OTHER): Payer: Self-pay

## 2023-07-26 DIAGNOSIS — I10 Essential (primary) hypertension: Secondary | ICD-10-CM | POA: Diagnosis not present

## 2023-07-26 DIAGNOSIS — J069 Acute upper respiratory infection, unspecified: Secondary | ICD-10-CM | POA: Diagnosis not present

## 2023-08-02 DIAGNOSIS — F4323 Adjustment disorder with mixed anxiety and depressed mood: Secondary | ICD-10-CM | POA: Diagnosis not present

## 2023-08-06 DIAGNOSIS — F4310 Post-traumatic stress disorder, unspecified: Secondary | ICD-10-CM | POA: Diagnosis not present

## 2023-08-06 DIAGNOSIS — F909 Attention-deficit hyperactivity disorder, unspecified type: Secondary | ICD-10-CM | POA: Diagnosis not present

## 2023-08-06 DIAGNOSIS — Z1231 Encounter for screening mammogram for malignant neoplasm of breast: Secondary | ICD-10-CM | POA: Diagnosis not present

## 2023-08-06 DIAGNOSIS — F411 Generalized anxiety disorder: Secondary | ICD-10-CM | POA: Diagnosis not present

## 2023-08-06 DIAGNOSIS — F339 Major depressive disorder, recurrent, unspecified: Secondary | ICD-10-CM | POA: Diagnosis not present

## 2023-08-21 DIAGNOSIS — F4323 Adjustment disorder with mixed anxiety and depressed mood: Secondary | ICD-10-CM | POA: Diagnosis not present

## 2023-08-21 DIAGNOSIS — R7303 Prediabetes: Secondary | ICD-10-CM | POA: Diagnosis not present

## 2023-08-21 DIAGNOSIS — Z1330 Encounter for screening examination for mental health and behavioral disorders, unspecified: Secondary | ICD-10-CM | POA: Diagnosis not present

## 2023-08-21 DIAGNOSIS — Z0000 Encounter for general adult medical examination without abnormal findings: Secondary | ICD-10-CM | POA: Diagnosis not present

## 2023-08-21 DIAGNOSIS — I10 Essential (primary) hypertension: Secondary | ICD-10-CM | POA: Diagnosis not present

## 2023-08-21 DIAGNOSIS — F411 Generalized anxiety disorder: Secondary | ICD-10-CM | POA: Diagnosis not present

## 2023-08-21 DIAGNOSIS — D251 Intramural leiomyoma of uterus: Secondary | ICD-10-CM | POA: Diagnosis not present

## 2023-08-21 DIAGNOSIS — N920 Excessive and frequent menstruation with regular cycle: Secondary | ICD-10-CM | POA: Diagnosis not present

## 2023-08-21 DIAGNOSIS — N946 Dysmenorrhea, unspecified: Secondary | ICD-10-CM | POA: Diagnosis not present

## 2023-09-04 DIAGNOSIS — F4323 Adjustment disorder with mixed anxiety and depressed mood: Secondary | ICD-10-CM | POA: Diagnosis not present

## 2023-09-11 DIAGNOSIS — L281 Prurigo nodularis: Secondary | ICD-10-CM | POA: Diagnosis not present

## 2023-09-17 DIAGNOSIS — G4700 Insomnia, unspecified: Secondary | ICD-10-CM | POA: Diagnosis not present

## 2023-09-17 DIAGNOSIS — F411 Generalized anxiety disorder: Secondary | ICD-10-CM | POA: Diagnosis not present

## 2023-09-17 DIAGNOSIS — F339 Major depressive disorder, recurrent, unspecified: Secondary | ICD-10-CM | POA: Diagnosis not present

## 2023-09-26 DIAGNOSIS — B9789 Other viral agents as the cause of diseases classified elsewhere: Secondary | ICD-10-CM | POA: Diagnosis not present

## 2023-09-26 DIAGNOSIS — R030 Elevated blood-pressure reading, without diagnosis of hypertension: Secondary | ICD-10-CM | POA: Diagnosis not present

## 2023-09-26 DIAGNOSIS — Z79899 Other long term (current) drug therapy: Secondary | ICD-10-CM | POA: Diagnosis not present

## 2023-09-26 DIAGNOSIS — Z880 Allergy status to penicillin: Secondary | ICD-10-CM | POA: Diagnosis not present

## 2023-09-26 DIAGNOSIS — Z888 Allergy status to other drugs, medicaments and biological substances status: Secondary | ICD-10-CM | POA: Diagnosis not present

## 2023-09-26 DIAGNOSIS — R0981 Nasal congestion: Secondary | ICD-10-CM | POA: Diagnosis not present

## 2023-09-26 DIAGNOSIS — I10 Essential (primary) hypertension: Secondary | ICD-10-CM | POA: Diagnosis not present

## 2023-09-26 DIAGNOSIS — Z91013 Allergy to seafood: Secondary | ICD-10-CM | POA: Diagnosis not present

## 2023-09-26 DIAGNOSIS — R000 Tachycardia, unspecified: Secondary | ICD-10-CM | POA: Diagnosis not present

## 2023-09-26 DIAGNOSIS — K219 Gastro-esophageal reflux disease without esophagitis: Secondary | ICD-10-CM | POA: Diagnosis not present

## 2023-09-26 DIAGNOSIS — J069 Acute upper respiratory infection, unspecified: Secondary | ICD-10-CM | POA: Diagnosis not present

## 2023-09-26 DIAGNOSIS — R509 Fever, unspecified: Secondary | ICD-10-CM | POA: Diagnosis not present

## 2023-09-26 DIAGNOSIS — R059 Cough, unspecified: Secondary | ICD-10-CM | POA: Diagnosis not present

## 2023-10-02 DIAGNOSIS — F4323 Adjustment disorder with mixed anxiety and depressed mood: Secondary | ICD-10-CM | POA: Diagnosis not present

## 2023-10-24 ENCOUNTER — Encounter (HOSPITAL_BASED_OUTPATIENT_CLINIC_OR_DEPARTMENT_OTHER): Payer: BC Managed Care – PPO | Admitting: Cardiovascular Disease

## 2023-10-30 DIAGNOSIS — F4323 Adjustment disorder with mixed anxiety and depressed mood: Secondary | ICD-10-CM | POA: Diagnosis not present

## 2023-11-12 DIAGNOSIS — I10 Essential (primary) hypertension: Secondary | ICD-10-CM | POA: Diagnosis not present

## 2023-11-12 DIAGNOSIS — M25461 Effusion, right knee: Secondary | ICD-10-CM | POA: Diagnosis not present

## 2023-11-29 DIAGNOSIS — F4323 Adjustment disorder with mixed anxiety and depressed mood: Secondary | ICD-10-CM | POA: Diagnosis not present

## 2023-12-16 ENCOUNTER — Other Ambulatory Visit (HOSPITAL_BASED_OUTPATIENT_CLINIC_OR_DEPARTMENT_OTHER): Payer: Self-pay | Admitting: Family

## 2023-12-16 DIAGNOSIS — I10 Essential (primary) hypertension: Secondary | ICD-10-CM

## 2023-12-21 ENCOUNTER — Ambulatory Visit (HOSPITAL_BASED_OUTPATIENT_CLINIC_OR_DEPARTMENT_OTHER): Payer: BC Managed Care – PPO | Admitting: Cardiovascular Disease

## 2023-12-21 ENCOUNTER — Encounter (HOSPITAL_BASED_OUTPATIENT_CLINIC_OR_DEPARTMENT_OTHER): Payer: Self-pay | Admitting: Cardiovascular Disease

## 2023-12-21 VITALS — BP 122/80 | HR 118 | Ht 69.0 in | Wt 274.2 lb

## 2023-12-21 DIAGNOSIS — R609 Edema, unspecified: Secondary | ICD-10-CM

## 2023-12-21 DIAGNOSIS — R000 Tachycardia, unspecified: Secondary | ICD-10-CM | POA: Diagnosis not present

## 2023-12-21 DIAGNOSIS — I479 Paroxysmal tachycardia, unspecified: Secondary | ICD-10-CM

## 2023-12-21 DIAGNOSIS — I10 Essential (primary) hypertension: Secondary | ICD-10-CM | POA: Diagnosis not present

## 2023-12-21 NOTE — Progress Notes (Signed)
 Advanced Hypertension Clinic Follow Up:    Date:  12/21/2023   ID:  Kathy Molina, DOB 1981/04/28, MRN [id number]  PCP:  Medicine, [facility] (Inactive)  Cardiologist:  None  Nephrologist:  Referring MD: No ref. provider found   CC: Hypertension  History of Present Illness:    Kathy Molina is a 43 y.o. female with a hx of hypertension, hypothyroidism, diabetes, GERD, fibroids, migraines, and depression, here for follow up.  She was first seen to establish care in the Advanced Hypertension Clinic. She saw Dr. Cherly Hensen 05/04/2022 and her blood pressure was 125/90. She was previously intolerant of HCTZ. She was referred to the Advanced Hypertension clinic for second opinion on hypertension management. She was seen by Meredeth Ide, PA-C 12/04/2022 and her blood pressure was 122/84. Her losartan had previously been increased to 100 mg. She felt like this was working along with trying to eliminate salt from her diet, although she was not monitoring home blood pressures.  Kathy Molina was first diagnosed with hypertension in 2023.  At her visit 12/2022 she was on losartan and noted that she struggled with checking her blood pressure at home due to anxiety.  Blood pressure in the office was initially 162/110 and improved to 136/98 on recheck.  She was not getting much exercise.  She was referred to the prep program.  She was preparing for fibroid surgery with the hopes of becoming pregnant soon.  She follow-up with Gillian Shields, NP 04/2023 and her blood pressure was 138/86.  Nifedipine was added.  Her home blood pressure was averaging in the 130s to 140s.  An NMR home sleep study was but not yet performed  Kathy Molina has been experiencing a persistent elevated heart rate, initially noted during a recent urgent care visit. The heart rate normalized after some time in the emergency room. She is unable to monitor her blood pressure at home due to a misplaced meter. She suspects nervousness might  contribute to the elevated heart rate but does not feel symptomatic when it occurs. She is currently taking nifedipine for blood pressure management. No sensation of heart racing or breathing difficulties.  She has noticed swelling in her feet after taking Alka-Seltzer for cold symptoms, which is unusual for her. She has been consuming a lot of water due to having a cold. The swelling is a new occurrence, and she cannot recall the last time it happened.  The patient describes a fall around Thanksgiving, resulting in a scar and later a lump on her knee. Urgent care identified it as fluid and advised elevation and ibuprofen. The lump has reduced but remains, and the knee becomes sore in cold weather.  She has been under significant stress due to a recent move to [place] to be closer to family, which has been challenging. 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 She feels the impact of leaving her support system and adjusting to a new environment. She is working on managing stress and depression, including setting goals to improve her routine and health.     Previous antihypertensives: HCTZ - pruritus Amlodipine - swelling  Past Medical History:  Diagnosis Date   Depression    Diabetes mellitus without complication (HCC)    borderline   Essential hypertension 02/16/2023   Fibroid uterus    GERD (gastroesophageal reflux disease)    Hypothyroidism    was told she had low thyroid level, but no follow up since   Migraine    Motion  sickness    Seasonal allergies    Sleep walking     Past Surgical History:  Procedure Laterality Date   MYOMECTOMY N/A 11/30/2015   Procedure: MYOMECTOMY;  Surgeon: Catalina Antigua, MD;  Location: [facility];  Service: Gynecology;  Laterality: N/A;    Current Medications: No outpatient medications have been marked as taking for the 12/21/23 encounter (Appointment) with Chilton Si, MD.     Allergies:   Augmentin  [amoxicillin-pot clavulanate], Other, Fish oil, and Shellfish allergy   Social History   Socioeconomic History   Marital status: Single    Spouse name: Not on file   Number of children: Not on file   Years of education: Not on file   Highest education level: Not on file  Occupational History   Not on file  Tobacco Use   Smoking status: Never   Smokeless tobacco: Never  Substance and Sexual Activity   Alcohol use: Yes    Comment: occassionally   Drug use: No   Sexual activity: Yes    Birth control/protection: Injection  Other Topics Concern   Not on file  Social History Narrative   Not on file   Social Drivers of Health   Financial Resource Strain: Medium Risk (08/21/2023)   Received from [facility]   Overall Financial Resource Strain (CARDIA)    Difficulty of Paying Living Expenses: Somewhat hard  Food Insecurity: No Food Insecurity (08/21/2023)   Received from [facility]   Hunger Vital Sign    Worried About Running Out of Food in the Last Year: Never true    Ran Out of Food in the Last Year: Never true  Transportation Needs: No Transportation Needs (08/21/2023)   Received from [facility] - Transportation    Lack of Transportation (Medical): No    Lack of Transportation (Non-Medical): No  Physical Activity: Unknown (08/21/2023)   Received from [facility]   Exercise Vital Sign    Days of Exercise per Week: 0 days    Minutes of Exercise per Session: Not on file  Stress: Stress Concern Present (08/21/2023)   Received from [facility] - Occupational Stress Questionnaire    Feeling of Stress : Very much  Social Connections: Moderately Integrated (08/21/2023)   Received from [facility]   Social Network    How would you rate your social network (family, work, friends)?: Adequate participation with social networks     Family History: The patient's family history includes Heart attack in her maternal  aunt; Hypertension in her father and maternal aunt; Stroke in her maternal aunt.  ROS:   Please see the history of present illness.    (+) Snoring (+) Daytime somnolence All other systems reviewed and are negative.  EKGs/Labs/Other Studies Reviewed:    Exercise Stress Test 07/31/2022  ([facility]): Patient exercised for 4 minutes on a standard protocol.  Patient achieved a workload of 7.2 METS, reached 96% of maximal  age-predicted heart rate.  Patient had normal hemodynamic response to exercise.  Baseline normal EKG, mild nonspecific ST changes with exercise.   Echocardiogram  07/31/2022  ([facility]): Impression: Left Ventricle: Left ventricle size is normal.    Left Ventricle: There is moderate concentric hypertrophy.    Left Ventricle: Systolic function is normal. EF: 60-65%.    Right Ventricle: Right ventricle size is normal.    Right Ventricle: Systolic function is normal.    EKG:  EKG is personally reviewed. 01/11/2023: Sinus tachycardia. Rate  103 bpm. LVH.  Recent Labs: No results found for requested labs within last 365 days.   Recent Lipid Panel No results found for: "CHOL", "TRIG", "HDL", "CHOLHDL", "VLDL", "LDLCALC", "LDLDIRECT"  Physical Exam:    VS:  There were no vitals taken for this visit. , BMI There is no height or weight on file to calculate BMI. GENERAL:  Well appearing HEENT: Pupils equal round and reactive, fundi not visualized, oral mucosa unremarkable NECK:  No jugular venous distention, waveform within normal limits, carotid upstroke brisk and symmetric, no bruits, no thyromegaly LUNGS:  Clear to auscultation bilaterally HEART: Tachycardic. PMI not displaced or sustained,S1 and S2 within normal limits, no S3, no S4, no clicks, no rubs, no murmurs ABD:  Flat, positive bowel sounds normal in frequency in pitch, no bruits, no rebound, no guarding, no midline pulsatile mass, no hepatomegaly, no splenomegaly EXT:  2 plus pulses throughout, no edema, no cyanosis,  no clubbing SKIN:  No rashes, no nodules NEURO:  Cranial nerves II through XII grossly intact, motor grossly intact throughout PSYCH:  Cognitively intact, oriented to person place and time   ASSESSMENT/PLAN:    # Hypertension Well controlled on Nifedipine. Blood pressure 122/80 today. -Continue Nifedipine. -Check blood pressure at home once unpacked.  # Tachycardia Heart rate elevated at previous visits, but no symptoms of palpitations or shortness of breath. No signs of heart failure on physical exam. -Order echocardiogram to assess cardiac function. -Consider starting Metoprolol if echocardiogram shows decreased cardiac function.  # Lower extremity edema New onset, possibly related to recent cold and use of Alka-Seltzer. -Continue monitoring at home. -Consider diuretic therapy if persists.  # Knee injury Persistent lump after fall in November, diagnosed as fluid collection at urgent care. -Continue conservative management with elevation and ibuprofen. -Consider imaging if no improvement.  # Stress/Depression Recent move and stressful job contributing to emotional distress. Patient is actively working on stress management and has a support system in place. -Encourage continued stress management strategies. -Schedule virtual follow-up in 3 months to reassess mental health and blood pressure control.      Screening for Secondary Hypertension:     01/11/2023    3:12 PM  Causes  Drugs/Herbals Screened     - Comments tries to limit salt.  3 teas daily.  No tob.  EtOH rare  Sleep Apnea Screened  Coarctation of the Aorta Screened     - Comments BP symmetric  Compliance Screened    Relevant Labs/Studies:    Latest Ref Rng & Units 05/06/2017    8:31 PM  Basic Labs  Sodium 135 - 145 mmol/L 135   Potassium 3.5 - 5.1 mmol/L 2.8   Creatinine 0.44 - 1.00 mg/dL 6.04      Disposition:    FU with [facility] C. Duke Salvia, MD, [facility] in 3 months.  Virtual is ok.  Medication  Adjustments/Labs and Tests Ordered: Current medicines are reviewed at length with the patient today.  Concerns regarding medicines are outlined above.   No orders of the defined types were placed in this encounter.  No orders of the defined types were placed in this encounter.    Signed, Chilton Si, MD  12/21/2023 9:05 AM    [facility]

## 2023-12-21 NOTE — Patient Instructions (Addendum)
Medication Instructions:  Your physician recommends that you continue on your current medications as directed. Please refer to the Current Medication list given to you today.   Labwork: NONE  Testing/Procedures: Your physician has requested that you have an echocardiogram. Echocardiography is a painless test that uses sound waves to create images of your heart. It provides your doctor with information about the size and shape of your heart and how well your heart's chambers and valves are working. This procedure takes approximately one hour. There are no restrictions for this procedure. Please do NOT wear cologne, perfume, aftershave, or lotions (deodorant is allowed). Please arrive 15 minutes prior to your appointment time.  Please note: We ask at that you not bring children with you during ultrasound (echo/ vascular) testing. Due to room size and safety concerns, children are not allowed in the ultrasound rooms during exams. Our front office staff cannot provide observation of children in our lobby area while testing is being conducted. An adult accompanying a patient to their appointment will only be allowed in the ultrasound room at the discretion of the ultrasound technician under special circumstances. We apologize for any inconvenience.  Follow-Up: VIRTUAL VISIT 3 MONTHS WITH DR [facility]   If you need a refill on your cardiac medications before your next appointment, please call your pharmacy.

## 2024-01-07 ENCOUNTER — Encounter (HOSPITAL_BASED_OUTPATIENT_CLINIC_OR_DEPARTMENT_OTHER): Payer: Self-pay | Admitting: Cardiovascular Disease

## 2024-01-07 DIAGNOSIS — I479 Paroxysmal tachycardia, unspecified: Secondary | ICD-10-CM

## 2024-01-07 HISTORY — DX: Paroxysmal tachycardia, unspecified: I47.9

## 2024-01-11 DIAGNOSIS — L281 Prurigo nodularis: Secondary | ICD-10-CM | POA: Diagnosis not present

## 2024-01-15 ENCOUNTER — Other Ambulatory Visit (HOSPITAL_BASED_OUTPATIENT_CLINIC_OR_DEPARTMENT_OTHER): Payer: BC Managed Care – PPO

## 2024-01-15 DIAGNOSIS — I10 Essential (primary) hypertension: Secondary | ICD-10-CM | POA: Diagnosis not present

## 2024-01-15 DIAGNOSIS — R000 Tachycardia, unspecified: Secondary | ICD-10-CM

## 2024-01-15 DIAGNOSIS — F4323 Adjustment disorder with mixed anxiety and depressed mood: Secondary | ICD-10-CM | POA: Diagnosis not present

## 2024-01-15 DIAGNOSIS — R609 Edema, unspecified: Secondary | ICD-10-CM

## 2024-01-15 LAB — ECHOCARDIOGRAM COMPLETE
Area-P 1/2: 5.02 cm2
S' Lateral: 2.71 cm

## 2024-02-15 DIAGNOSIS — F4323 Adjustment disorder with mixed anxiety and depressed mood: Secondary | ICD-10-CM | POA: Diagnosis not present

## 2024-02-20 DIAGNOSIS — R21 Rash and other nonspecific skin eruption: Secondary | ICD-10-CM | POA: Diagnosis not present

## 2024-02-29 ENCOUNTER — Encounter (HOSPITAL_BASED_OUTPATIENT_CLINIC_OR_DEPARTMENT_OTHER): Payer: Self-pay | Admitting: Cardiovascular Disease

## 2024-03-05 DIAGNOSIS — J3089 Other allergic rhinitis: Secondary | ICD-10-CM | POA: Diagnosis not present

## 2024-03-05 DIAGNOSIS — J3081 Allergic rhinitis due to animal (cat) (dog) hair and dander: Secondary | ICD-10-CM | POA: Diagnosis not present

## 2024-03-05 DIAGNOSIS — L281 Prurigo nodularis: Secondary | ICD-10-CM | POA: Diagnosis not present

## 2024-03-05 DIAGNOSIS — J301 Allergic rhinitis due to pollen: Secondary | ICD-10-CM | POA: Diagnosis not present

## 2024-03-05 DIAGNOSIS — Z0182 Encounter for allergy testing: Secondary | ICD-10-CM | POA: Diagnosis not present

## 2024-03-07 DIAGNOSIS — L281 Prurigo nodularis: Secondary | ICD-10-CM | POA: Diagnosis not present

## 2024-03-20 ENCOUNTER — Encounter (HOSPITAL_BASED_OUTPATIENT_CLINIC_OR_DEPARTMENT_OTHER): Payer: Self-pay | Admitting: Cardiovascular Disease

## 2024-03-20 ENCOUNTER — Telehealth (HOSPITAL_BASED_OUTPATIENT_CLINIC_OR_DEPARTMENT_OTHER): Payer: BC Managed Care – PPO | Admitting: Cardiovascular Disease

## 2024-03-20 VITALS — Ht 69.0 in | Wt 275.0 lb

## 2024-03-20 DIAGNOSIS — I479 Paroxysmal tachycardia, unspecified: Secondary | ICD-10-CM

## 2024-03-20 DIAGNOSIS — F4323 Adjustment disorder with mixed anxiety and depressed mood: Secondary | ICD-10-CM | POA: Diagnosis not present

## 2024-03-20 DIAGNOSIS — I10 Essential (primary) hypertension: Secondary | ICD-10-CM

## 2024-03-20 MED ORDER — NIFEDIPINE ER OSMOTIC RELEASE 60 MG PO TB24
60.0000 mg | ORAL_TABLET | Freq: Every day | ORAL | 3 refills | Status: DC
Start: 2024-03-20 — End: 2024-05-27

## 2024-03-20 NOTE — Patient Instructions (Addendum)
 increase nifedipine  to 60mg . f/u 2 months   Medication Instructions:  INCREASE YOUR NIFEDIPINE  TO 60 MG   Labwork: NONE  Testing/Procedures: NONE  Follow-Up: 06/06/2024 at 8:15 am with Dr Theodis Fiscal, mychart visit   Any Other Special Instructions Will Be Listed Below (If Applicable).     If you need a refill on your cardiac medications before your next appointment, please call your pharmacy.

## 2024-03-20 NOTE — Progress Notes (Signed)
 Virtual Visit via Video Note   Because of Kathy Molina's co-morbid illnesses, she is at least at moderate risk for complications without adequate follow up.  This format is felt to be most appropriate for this patient at this time.  All issues noted in this document were discussed and addressed.  A limited physical exam was performed with this format.  Please refer to the patient's chart for her consent to telehealth for [facility].      The patient was identified using 2 identifiers.  Date:  04/18/2024   ID:  Kathy Molina, DOB 1981/03/05, MRN [id number]  Patient Location: Home Provider Location: Office/Clinic  PCP:  Oleh Berliner, FNP  Cardiologist:  None  Electrophysiologist:  None   Evaluation Performed:  Follow-Up Visit  Chief Complaint:  hypertension  History of Present Illness:     The patient does not have symptoms concerning for COVID-19 infection (fever, chills, cough, or new shortness of breath).   Kathy Molina is a 43 y.o. female with a hx of hypertension, hypothyroidism, diabetes, GERD, fibroids, migraines, and depression, here for follow up.  She was first seen to establish care in the [facility]. She saw Dr. Lesta Rater 05/04/2022 and her blood pressure was 125/90. She was previously intolerant of HCTZ. She was referred to the Advanced Hypertension clinic for second opinion on hypertension management. She was seen by Stephens Eis, PA-C 12/04/2022 and her blood pressure was 122/84. Her losartan had previously been increased to 100 mg. She felt like this was working along with trying to eliminate salt from her diet, although she was not monitoring home blood pressures.  Kathy Molina was first diagnosed with hypertension in 2023.  At her visit 12/2022 she was on losartan and noted that she struggled with checking her blood pressure at home due to anxiety.  Blood pressure in the office was initially 162/110 and improved to 136/98 on recheck.  She was  not getting much exercise.  She was referred to the prep program.  She was preparing for fibroid surgery with the hopes of becoming pregnant soon.  She follow-up with [name], NP 04/2023 and her blood pressure was 138/86.  Nifedipine  was added.  Her home blood pressure was averaging in the 130s to 140s.    She has noted elevated heart rates.  She was also under a lot of stress at her appointment 11/2023.  Echo revealed LVEF 60-65% with mild LVH and normal diastolic function.   Discussed the use of AI scribe software for clinical note transcription with the patient, who gave verbal consent to proceed.  History of Present Illness Kathy Molina has been experiencing pruritus for approximately one month, which is effectively controlled with prescribed medications, including an antipruritic and a topical cream from a dermatologist. Symptoms recur when these medications are not used. She suspects a possible allergen in her environment, such as seafood, shellfish, or something in her apartment, but no definitive cause has been identified. Previous allergy tests were inconclusive, possibly due to the use of Dupixent and antihistamines.  Her blood pressure has been elevated during recent checks, including a visit to urgent care three weeks ago where it was noted to be high. She attributes some of the elevation to the stress of pruritus. After receiving prednisone, her blood pressure decreased. At home, her blood pressure readings have been around 130/88 mmHg, with occasional higher readings at medical visits. She is currently taking nifedipine  for blood pressure management.  She has not been able to  exercise regularly due to her symptoms and environmental factors such as pollen. She is considering adjusting her routine to incorporate exercise either early in the morning or later in the evening to avoid allergens.   Previous antihypertensives: HCTZ - pruritus Amlodipine - swelling  Past Medical History:   Diagnosis Date   Depression    Diabetes mellitus without complication (HCC)    borderline   Essential hypertension 02/16/2023   Fibroid uterus    GERD (gastroesophageal reflux disease)    Hypothyroidism    was told she had low thyroid  level, but no follow up since   Migraine    Motion sickness    Paroxysmal tachycardia (HCC) 01/07/2024   Seasonal allergies    Sleep walking     Past Surgical History:  Procedure Laterality Date   MYOMECTOMY N/A 11/30/2015   Procedure: MYOMECTOMY;  Surgeon: Verlyn Goad, MD;  Location: [facility];  Service: Gynecology;  Laterality: N/A;    Current Medications: Current Meds  Medication Sig   buPROPion (WELLBUTRIN XL) 300 MG 24 hr tablet Take 300 mg by mouth daily.   cetirizine (ZYRTEC) 10 MG tablet Take 10 mg by mouth daily.   cyclobenzaprine (FLEXERIL) 10 MG tablet Take 10 mg by mouth 3 (three) times daily as needed.   diphenhydrAMINE  (SOMINEX) 25 MG tablet Take 25 mg by mouth as needed for allergies. Reported on 01/14/2016   DULoxetine (CYMBALTA) 30 MG capsule Take 90 mg by mouth daily.   DUPIXENT 300 MG/2ML SOAJ Inject 1 each into the skin every 14 (fourteen) days. 1 INJECTION EVERY 2 WEEKS   EPINEPHrine (EPIPEN 2-PAK) 0.3 mg/0.3 mL IJ SOAJ injection Inject 0.3 mg into the muscle as needed.   famotidine (PEPCID) 20 MG tablet Take 20 mg by mouth 2 (two) times daily.   fluticasone (FLONASE) 50 MCG/ACT nasal spray Place 2 sprays into both nostrils as needed for allergies.   hydrOXYzine (VISTARIL) 25 MG capsule Take 25 mg by mouth as needed for itching.   ibuprofen  (ADVIL ) 800 MG tablet Take 800 mg by mouth 3 (three) times daily.   omeprazole (PRILOSEC) 10 MG capsule Take 10 mg by mouth daily.   [DISCONTINUED] NIFEdipine  (PROCARDIA -XL/NIFEDICAL-XL) 30 MG 24 hr tablet TAKE 1 TABLET BY MOUTH DAILY     Allergies:   Augmentin [amoxicillin-pot clavulanate], Other, Fish oil, and Shellfish allergy   Social History   Socioeconomic History   Marital  status: Single    Spouse name: Not on file   Number of children: Not on file   Years of education: Not on file   Highest education level: Not on file  Occupational History   Not on file  Tobacco Use   Smoking status: Never   Smokeless tobacco: Never  Substance and Sexual Activity   Alcohol use: Yes    Comment: occassionally   Drug use: No   Sexual activity: Yes    Birth control/protection: Injection  Other Topics Concern   Not on file  Social History Narrative   Not on file   Social Drivers of Health   Financial Resource Strain: Low Risk  (02/20/2024)   Received from [facility]   Overall Financial Resource Strain (CARDIA)    Difficulty of Paying Living Expenses: Not hard at all  Food Insecurity: No Food Insecurity (02/20/2024)   Received from [facility]   Hunger Vital Sign    Worried About Running Out of Food in the Last Year: Never true    Ran Out of Food in the Last  Year: Never true  Transportation Needs: No Transportation Needs (02/20/2024)   Received from [facility] - Transportation    Lack of Transportation (Medical): No    Lack of Transportation (Non-Medical): No  Physical Activity: Unknown (02/20/2024)   Received from [facility]   Exercise Vital Sign    Days of Exercise per Week: 0 days    Minutes of Exercise per Session: Not on file  Stress: Stress Concern Present (02/20/2024)   Received from [facility] - Occupational Stress Questionnaire    Feeling of Stress : To some extent  Social Connections: Moderately Integrated (02/20/2024)   Received from [facility]   Social Network    How would you rate your social network (family, work, friends)?: Adequate participation with social networks     Family History: The patient's family history includes Heart attack in her maternal aunt; Hypertension in her father and maternal aunt; Stroke in her maternal aunt.  ROS:   Please see the history of present  illness.    (+) Snoring (+) Daytime somnolence All other systems reviewed and are negative.  EKGs/Labs/Other Studies Reviewed:    Exercise Stress Test 07/31/2022  ([facility]): Patient exercised for 4 minutes on a standard protocol.  Patient achieved a workload of 7.2 METS, reached 96% of maximal  age-predicted heart rate.  Patient had normal hemodynamic response to exercise.  Baseline normal EKG, mild nonspecific ST changes with exercise.   Echocardiogram  07/31/2022  ([facility]): Impression: Left Ventricle: Left ventricle size is normal.    Left Ventricle: There is moderate concentric hypertrophy.    Left Ventricle: Systolic function is normal. EF: 60-65%.    Right Ventricle: Right ventricle size is normal.    Right Ventricle: Systolic function is normal.    EKG:  EKG is personally reviewed. 01/11/2023: Sinus tachycardia. Rate 103 bpm. LVH.  Recent Labs: No results found for requested labs within last 365 days.   Recent Lipid Panel No results found for: "CHOL", "TRIG", "HDL", "CHOLHDL", "VLDL", "LDLCALC", "LDLDIRECT"  Physical Exam:    Ht 5\' 9"  (1.753 m)   Wt 275 lb (124.7 kg)   BMI 40.61 kg/m  GENERAL: Well-appearing.  No acute distress. HEENT: Pupils equal round.  Oral mucosa unremarkable NECK:  No jugular venous distention, no visible thyromegaly EXT:  No edema, no cyanosis no clubbing SKIN:  No rashes no nodules NEURO:  Speech fluent.  Cranial nerves grossly intact.  Moves all 4 extremities freely PSYCH:  Cognitively intact, oriented to person place and time    ASSESSMENT/PLAN:    Assessment & Plan # Hypertension Blood pressure slightly above target, exacerbated by stress. Current nifedipine  dose insufficient.  BP goal <130/80. - Increase nifedipine  to 60 mg daily. - Advise regular home blood pressure monitoring. - Instruct to report via MyChart if blood pressure is too low or adverse symptoms occur. - Plan follow-up video visit in a couple of months.  #  Pruritus Pruritus of unclear etiology, managed with current medication and cream. Awaiting insurance approval for additional treatment. - Continue current itch medication and cream. - Await insurance approval for additional treatment.    Screening for Secondary Hypertension:     01/11/2023    3:12 PM  Causes  Drugs/Herbals Screened     - Comments tries to limit salt.  3 teas daily.  No tob.  EtOH rare  Sleep Apnea Screened  Coarctation of the Aorta Screened     - Comments BP  symmetric  Compliance Screened    Relevant Labs/Studies:    Latest Ref Rng & Units 05/06/2017    8:31 PM  Basic Labs  Sodium 135 - 145 mmol/L 135   Potassium 3.5 - 5.1 mmol/L 2.8   Creatinine 0.44 - 1.00 mg/dL 1.61      Disposition:    FU with Auther Lyerly C. Theodis Fiscal, MD, [facility] 05/2024  Medication Adjustments/Labs and Tests Ordered: Current medicines are reviewed at length with the patient today.  Concerns regarding medicines are outlined above.   No orders of the defined types were placed in this encounter.  Meds ordered this encounter  Medications   NIFEdipine  (PROCARDIA  XL/NIFEDICAL XL) 60 MG 24 hr tablet    Sig: Take 1 tablet (60 mg total) by mouth daily.    Dispense:  90 tablet    Refill:  3    NEW DOSE, D/C 30 MG RX     COVID-19 Education: The signs and symptoms of COVID-19 were discussed with the patient and how to seek care for testing (follow up with PCP or arrange E-visit).  The importance of social distancing was discussed today.  Time:   Today, I have spent 17 minutes with the patient with telehealth technology discussing the above problems.    Signed, Maudine Sos, MD  04/18/2024 6:09 PM    [facility]

## 2024-04-02 DIAGNOSIS — E66813 Obesity, class 3: Secondary | ICD-10-CM | POA: Diagnosis not present

## 2024-04-02 DIAGNOSIS — Z7689 Persons encountering health services in other specified circumstances: Secondary | ICD-10-CM | POA: Diagnosis not present

## 2024-04-02 DIAGNOSIS — I10 Essential (primary) hypertension: Secondary | ICD-10-CM | POA: Diagnosis not present

## 2024-04-02 DIAGNOSIS — M7989 Other specified soft tissue disorders: Secondary | ICD-10-CM | POA: Diagnosis not present

## 2024-04-02 DIAGNOSIS — Z1330 Encounter for screening examination for mental health and behavioral disorders, unspecified: Secondary | ICD-10-CM | POA: Diagnosis not present

## 2024-04-02 DIAGNOSIS — I479 Paroxysmal tachycardia, unspecified: Secondary | ICD-10-CM | POA: Diagnosis not present

## 2024-04-15 ENCOUNTER — Encounter (HOSPITAL_BASED_OUTPATIENT_CLINIC_OR_DEPARTMENT_OTHER): Payer: Self-pay | Admitting: Cardiovascular Disease

## 2024-04-15 DIAGNOSIS — L281 Prurigo nodularis: Secondary | ICD-10-CM | POA: Diagnosis not present

## 2024-04-25 DIAGNOSIS — L281 Prurigo nodularis: Secondary | ICD-10-CM | POA: Diagnosis not present

## 2024-04-25 DIAGNOSIS — J301 Allergic rhinitis due to pollen: Secondary | ICD-10-CM | POA: Diagnosis not present

## 2024-04-25 DIAGNOSIS — T781XXD Other adverse food reactions, not elsewhere classified, subsequent encounter: Secondary | ICD-10-CM | POA: Diagnosis not present

## 2024-04-28 DIAGNOSIS — F4323 Adjustment disorder with mixed anxiety and depressed mood: Secondary | ICD-10-CM | POA: Diagnosis not present

## 2024-05-01 DIAGNOSIS — I10 Essential (primary) hypertension: Secondary | ICD-10-CM | POA: Diagnosis not present

## 2024-05-01 DIAGNOSIS — M76899 Other specified enthesopathies of unspecified lower limb, excluding foot: Secondary | ICD-10-CM | POA: Diagnosis not present

## 2024-05-01 DIAGNOSIS — M25561 Pain in right knee: Secondary | ICD-10-CM | POA: Diagnosis not present

## 2024-05-26 NOTE — Progress Notes (Signed)
 "   Virtual Visit via Video Note   Because of Kathy Molina's co-morbid illnesses, she is at least at moderate risk for complications without adequate follow up.  This format is felt to be most appropriate for this patient at this time.  All issues noted in this document were discussed and addressed.  A limited physical exam was performed with this format.  Please refer to the patient's chart for her consent to telehealth for [facility].   The patient was identified using 2 identifiers.  Date:  05/27/2024   ID:  Kathy Molina, DOB [birth year], MRN [id number]  Patient Location: Home Provider Location: Office/Clinic  PCP:  Tanda Stabs, FNP  Cardiologist:  None  Electrophysiologist:  None   Evaluation Performed:  Follow-Up Visit Video visit  Chief Complaint:  hypertension  History of Present Illness:     The patient does not have symptoms concerning for COVID-19 infection (fever, chills, cough, or new shortness of breath).   Kathy Molina is a 43 y.o. female with a hx of hypertension, hypothyroidism, diabetes, GERD, fibroids, migraines, and depression, here for follow up.  She was first seen to establish care in the [facility] Hypertension Clinic. She saw Dr. Rutherford 05/04/2022 and her blood pressure was 125/90. She was previously intolerant of HCTZ. She was referred to the Advanced Hypertension clinic for second opinion on hypertension management. She was seen by Norleen Hasten, PA-C 12/04/2022 and her blood pressure was 122/84. Her losartan had previously been increased to 100 mg. She felt like this was working along with trying to eliminate salt from her diet, although she was not monitoring home blood pressures.  Ms.Hartstein was first diagnosed with hypertension in 2023.  At her visit 12/2022 she was on losartan and noted that she struggled with checking her blood pressure at home due to anxiety.  Blood pressure in the office was initially 162/110 and improved to 136/98 on recheck.   She was not getting much exercise.  She was referred to the prep program.  She was preparing for fibroid surgery with the hopes of becoming pregnant soon.  She follow-up with Kathy Finder, NP 04/2023 and her blood pressure was 138/86.  Nifedipine  was added.  Her home blood pressure was averaging in the 130s to 140s.    She has noted elevated heart rates.  She was also under a lot of stress at her appointment 11/2023.  Echo revealed LVEF 60-65% with mild LVH and normal diastolic function. She was seen 03/2024 and her BP was slightly above goal.  Nifedipine  was increased.    Discussed the use of AI scribe software for clinical note transcription with the patient, who gave verbal consent to proceed.  History of Present Illness 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 She was unaware that consuming orange juice and apples was triggering these reactions. Her condition is somewhat controlled with Numlovo injections, taken once a month, and she has completed two doses. She anticipates more relief after the third dose. She also takes allergy medication twice daily and as needed.  Her blood pressure readings have been stable, but she experiences intermittent leg swelling, which she suspects may be related to an increased dose of nifedipine . Her diastolic pressure has consistently been high during recent doctor visits. A urine screen showed protein, but it was not considered significantly high. She manages the swelling by elevating her legs, although this is challenging due to  her daily activities.  She has not taken Cymbalta and Wellbutrin for a week due to running out of them. She recalls that Zimbala helps with her pain, which she has been experiencing more intensely. She is awaiting a referral to a psychiatrist for further management. Her potassium levels have been low in the past, but her  creatinine is normal. She inquires about the potential benefits of magnesium for her blood pressure, noting that stress is a significant issue for her.   Previous antihypertensives: HCTZ - pruritus Amlodipine - swelling  Past Medical History:  Diagnosis Date   Depression    Diabetes mellitus without complication (HCC)    borderline   Essential hypertension 02/16/2023   Fibroid uterus    GERD (gastroesophageal reflux disease)    Hypothyroidism    was told she had low thyroid  level, but no follow up since   Migraine    Motion sickness    Paroxysmal tachycardia (HCC) 01/07/2024   Seasonal allergies    Sleep walking     Past Surgical History:  Procedure Laterality Date   MYOMECTOMY N/A 11/30/2015   Procedure: MYOMECTOMY;  Surgeon: Winton Felt, MD;  Location: [facility];  Service: Gynecology;  Laterality: N/A;    Current Medications: Current Meds  Medication Sig   cetirizine (ZYRTEC) 10 MG tablet Take 10 mg by mouth daily. (Patient taking differently: Take 10 mg by mouth 2 (two) times daily.)   cyclobenzaprine (FLEXERIL) 10 MG tablet Take 10 mg by mouth 3 (three) times daily as needed.   diphenhydrAMINE  (SOMINEX) 25 MG tablet Take 25 mg by mouth as needed for allergies. Reported on 01/14/2016   EPINEPHrine (EPIPEN 2-PAK) 0.3 mg/0.3 mL IJ SOAJ injection Inject 0.3 mg into the muscle as needed.   famotidine (PEPCID) 20 MG tablet Take 20 mg by mouth 2 (two) times daily.   fluticasone (FLONASE) 50 MCG/ACT nasal spray Place 2 sprays into both nostrils as needed for allergies.   hydrOXYzine (VISTARIL) 25 MG capsule Take 25 mg by mouth as needed for itching.   ibuprofen  (ADVIL ) 800 MG tablet Take 800 mg by mouth 3 (three) times daily. (Patient taking differently: Take 800 mg by mouth every 8 (eight) hours as needed.)   nemolizumab-ilto (NEMLUVIO) 30 MG SQ injection Inject 30 mg into the skin. Every 4 weeks   omeprazole (PRILOSEC) 10 MG capsule Take 10 mg by mouth daily.   spironolactone   (ALDACTONE ) 25 MG tablet Take 1 tablet (25 mg total) by mouth daily.   [DISCONTINUED] NIFEdipine  (PROCARDIA  XL/NIFEDICAL XL) 60 MG 24 hr tablet Take 1 tablet (60 mg total) by mouth daily.     Allergies:   Augmentin [amoxicillin-pot clavulanate], Other, Fish oil, and Shellfish allergy   Social History   Socioeconomic History   Marital status: Single    Spouse name: Not on file   Number of children: Not on file   Years of education: Not on file   Highest education level: Not on file  Occupational History   Not on file  Tobacco Use   Smoking status: Never   Smokeless tobacco: Never  Substance and Sexual Activity   Alcohol use: Yes    Comment: occassionally   Drug use: No   Sexual activity: Yes    Birth control/protection: Injection  Other Topics Concern   Not on file  Social History Narrative   Not on file   Social Drivers of Health   Financial Resource Strain: Low Risk  (02/20/2024)   Received from [facility]  Overall Financial Resource Strain (CARDIA)    Difficulty of Paying Living Expenses: Not hard at all  Food Insecurity: No Food Insecurity (02/20/2024)   Received from [facility]   Hunger Vital Sign    Within the past 12 months, you worried that your food would run out before you got the money to buy more.: Never true    Within the past 12 months, the food you bought just didn't last and you didn't have money to get more.: Never true  Transportation Needs: No Transportation Needs (02/20/2024)   Received from [facility] - Transportation    Lack of Transportation (Medical): No    Lack of Transportation (Non-Medical): No  Physical Activity: Unknown (02/20/2024)   Received from [facility]   Exercise Vital Sign    On average, how many days per week do you engage in moderate to strenuous exercise (like a brisk walk)?: 0 days    Minutes of Exercise per Session: Not on file  Stress: Stress Concern Present (02/20/2024)   Received from [facility] - Occupational Stress Questionnaire    Feeling of Stress : To some extent  Social Connections: Moderately Integrated (02/20/2024)   Received from [facility]   Social Network    How would you rate your social network (family, work, friends)?: Adequate participation with social networks     Family History: The patient's family history includes Heart attack in her maternal aunt; Hypertension in her father and maternal aunt; Stroke in her maternal aunt.  ROS:   Please see the history of present illness.    (+) Snoring (+) Daytime somnolence All other systems reviewed and are negative.  EKGs/Labs/Other Studies Reviewed:    Exercise Stress Test 07/31/2022  ([facility]): Patient exercised for 4 minutes on a standard protocol.  Patient achieved a workload of 7.2 METS, reached 96% of maximal  age-predicted heart rate.  Patient had normal hemodynamic response to exercise.  Baseline normal EKG, mild nonspecific ST changes with exercise.   Echocardiogram  07/31/2022  ([facility]): Impression: Left Ventricle: Left ventricle size is normal.    Left Ventricle: There is moderate concentric hypertrophy.    Left Ventricle: Systolic function is normal. EF: 60-65%.    Right Ventricle: Right ventricle size is normal.    Right Ventricle: Systolic function is normal.    EKG:  EKG is personally reviewed. 01/11/2023: Sinus tachycardia. Rate 103 bpm. LVH.  Recent Labs: No results found for requested labs within last 365 days.   Recent Lipid Panel No results found for: CHOL, TRIG, HDL, CHOLHDL, VLDL, LDLCALC, LDLDIRECT  Physical Exam:    BP (!) 117/94   Pulse 98   Ht 5' 9 (1.753 m)   Wt 276 lb (125.2 kg)   BMI 40.76 kg/m  GENERAL: Well-appearing.  No acute distress. HEENT: Pupils equal round.  Oral mucosa unremarkable NECK:  No jugular venous distention, no visible thyromegaly EXT:  No edema, no cyanosis no clubbing SKIN:  No rashes no  nodules NEURO:  Speech fluent.  Cranial nerves grossly intact.  Moves all 4 extremities freely PSYCH:  Cognitively intact, oriented to person place and time    ASSESSMENT/PLAN:    Assessment & Plan # Hypertension Consistently elevated diastolic readings, current regimen includes nifedipine  which has been causing leg swelling. - Reduce nifedipine  to 30 mg daily. - Initiate spironolactone  25 mg once daily. - Order basic metabolic panel in one week to monitor potassium levels. - Instruct  to track blood pressure readings. - Follow up in two months.  # Leg Swelling due to Nifedipine  Intermittent leg swelling likely secondary to nifedipine , coinciding with increased dose. - Reduce nifedipine  to 30 mg daily. - Initiate spironolactone  25 mg once daily to address fluid retention.  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 Switched to Numlovo injections, expecting improved control after third dose.  # Depression Suboptimal management due to medication lapse, awaiting psychiatric consultation. Increased pain possibly related to absence of Zinvolta.  # Stress and Pain Management Stress and pain may contribute to elevated blood pressure. Magnesium discussed for stress and muscle aches. - Consider magnesium supplementation for stress and muscle aches.   Screening for Secondary Hypertension:     01/11/2023    3:12 PM  Causes  Drugs/Herbals Screened     - Comments tries to limit salt.  3 teas daily.  No tob.  EtOH rare  Sleep Apnea Screened  Coarctation of the Aorta Screened     - Comments BP symmetric  Compliance Screened    Relevant Labs/Studies:    Latest Ref Rng & Units 05/06/2017    8:31 PM  Basic Labs  Sodium 135 - 145 mmol/L 135   Potassium 3.5 - 5.1 mmol/L 2.8   Creatinine 0.44 - 1.00 mg/dL 8.99      Disposition:    FU with Octave Montrose C. Raford, MD, [facility] 05/2024  Medication Adjustments/Labs and  Tests Ordered: Current medicines are reviewed at length with the patient today.  Concerns regarding medicines are outlined above.   Orders Placed This Encounter  Procedures   Basic metabolic panel with GFR   Meds ordered this encounter  Medications   spironolactone  (ALDACTONE ) 25 MG tablet    Sig: Take 1 tablet (25 mg total) by mouth daily.    Dispense:  90 tablet    Refill:  1   NIFEdipine  (ADALAT  CC) 30 MG 24 hr tablet    Sig: Take 1 tablet (30 mg total) by mouth daily.    Dispense:  90 tablet    Refill:  1    NEW DOSE, D/C 60 MG RX    COVID-19 Education: The signs and symptoms of COVID-19 were discussed with the patient and how to seek care for testing (follow up with PCP or arrange E-visit).  The importance of social distancing was discussed today.  Time:   Today, I have spent 20 minutes with the patient with telehealth technology discussing the above problems.    Signed, Annabella Raford, MD  05/27/2024 10:07 AM    [facility]  "

## 2024-05-27 ENCOUNTER — Other Ambulatory Visit (HOSPITAL_COMMUNITY): Payer: Self-pay

## 2024-05-27 ENCOUNTER — Encounter (HOSPITAL_BASED_OUTPATIENT_CLINIC_OR_DEPARTMENT_OTHER): Payer: Self-pay | Admitting: *Deleted

## 2024-05-27 ENCOUNTER — Encounter (HOSPITAL_BASED_OUTPATIENT_CLINIC_OR_DEPARTMENT_OTHER): Payer: Self-pay

## 2024-05-27 ENCOUNTER — Encounter (HOSPITAL_BASED_OUTPATIENT_CLINIC_OR_DEPARTMENT_OTHER): Payer: Self-pay | Admitting: Cardiovascular Disease

## 2024-05-27 ENCOUNTER — Encounter: Payer: Self-pay | Admitting: Pharmacy Technician

## 2024-05-27 ENCOUNTER — Telehealth (INDEPENDENT_AMBULATORY_CARE_PROVIDER_SITE_OTHER): Admitting: Cardiovascular Disease

## 2024-05-27 DIAGNOSIS — I479 Paroxysmal tachycardia, unspecified: Secondary | ICD-10-CM | POA: Diagnosis not present

## 2024-05-27 DIAGNOSIS — I10 Essential (primary) hypertension: Secondary | ICD-10-CM | POA: Diagnosis not present

## 2024-05-27 DIAGNOSIS — Z9889 Other specified postprocedural states: Secondary | ICD-10-CM

## 2024-05-27 DIAGNOSIS — Z5181 Encounter for therapeutic drug level monitoring: Secondary | ICD-10-CM | POA: Diagnosis not present

## 2024-05-27 DIAGNOSIS — F4323 Adjustment disorder with mixed anxiety and depressed mood: Secondary | ICD-10-CM | POA: Diagnosis not present

## 2024-05-27 MED ORDER — SPIRONOLACTONE 25 MG PO TABS: 25.0000 mg | ORAL_TABLET | Freq: Every day | ORAL | 1 refills | Status: DC

## 2024-05-27 MED ORDER — NIFEDIPINE ER 30 MG PO TB24: 30.0000 mg | ORAL_TABLET | Freq: Every day | ORAL | 1 refills | Status: DC

## 2024-05-27 NOTE — Patient Instructions (Addendum)
 Medication Instructions:  DECREASE NIFEDIPINE  TO 30 MG DAILY   START SPIRONOLACTONE  25 MG DAILY   Labwork: BMET IN 1 WEEK   Testing/Procedures: NONE  Follow-Up: 07/31/2024 AT 1:30 PM   If you need a refill on your cardiac medications before your next appointment, please call your pharmacy.

## 2024-05-27 NOTE — Telephone Encounter (Signed)
 This encounter was created in error - please disregard.

## 2024-05-27 NOTE — Telephone Encounter (Signed)
 Hi, so I was starting to submit for this prior authorization but I couldn't find where she has hypercholesterolemia, ascvd-no heart attack/stroke/pad/angina, nor hyperlipidemia. Her last lab on 08/21/2023 was 58. Her ascvd risk score is 1.1%. She has never been on a statin that I could find. Just wanted you to know this will probably be denied unless I'm missing something I can say yes to?       Can't find a A1c higher than this

## 2024-06-10 DIAGNOSIS — M25561 Pain in right knee: Secondary | ICD-10-CM | POA: Diagnosis not present

## 2024-06-10 DIAGNOSIS — M76899 Other specified enthesopathies of unspecified lower limb, excluding foot: Secondary | ICD-10-CM | POA: Diagnosis not present

## 2024-06-12 DIAGNOSIS — M76899 Other specified enthesopathies of unspecified lower limb, excluding foot: Secondary | ICD-10-CM | POA: Diagnosis not present

## 2024-06-12 DIAGNOSIS — M25561 Pain in right knee: Secondary | ICD-10-CM | POA: Diagnosis not present

## 2024-06-13 DIAGNOSIS — F411 Generalized anxiety disorder: Secondary | ICD-10-CM | POA: Diagnosis not present

## 2024-06-13 DIAGNOSIS — F331 Major depressive disorder, recurrent, moderate: Secondary | ICD-10-CM | POA: Diagnosis not present

## 2024-06-20 DIAGNOSIS — L281 Prurigo nodularis: Secondary | ICD-10-CM | POA: Diagnosis not present

## 2024-06-25 DIAGNOSIS — Z0000 Encounter for general adult medical examination without abnormal findings: Secondary | ICD-10-CM | POA: Diagnosis not present

## 2024-06-25 DIAGNOSIS — Z1330 Encounter for screening examination for mental health and behavioral disorders, unspecified: Secondary | ICD-10-CM | POA: Diagnosis not present

## 2024-06-25 DIAGNOSIS — I10 Essential (primary) hypertension: Secondary | ICD-10-CM | POA: Diagnosis not present

## 2024-06-25 DIAGNOSIS — R7303 Prediabetes: Secondary | ICD-10-CM | POA: Diagnosis not present

## 2024-06-25 DIAGNOSIS — Z13220 Encounter for screening for lipoid disorders: Secondary | ICD-10-CM | POA: Diagnosis not present

## 2024-06-25 DIAGNOSIS — E66812 Obesity, class 2: Secondary | ICD-10-CM | POA: Diagnosis not present

## 2024-06-26 DIAGNOSIS — D649 Anemia, unspecified: Secondary | ICD-10-CM | POA: Diagnosis not present

## 2024-06-26 DIAGNOSIS — M76899 Other specified enthesopathies of unspecified lower limb, excluding foot: Secondary | ICD-10-CM | POA: Diagnosis not present

## 2024-06-26 DIAGNOSIS — M25561 Pain in right knee: Secondary | ICD-10-CM | POA: Diagnosis not present

## 2024-07-03 DIAGNOSIS — F4323 Adjustment disorder with mixed anxiety and depressed mood: Secondary | ICD-10-CM | POA: Diagnosis not present

## 2024-07-11 DIAGNOSIS — F411 Generalized anxiety disorder: Secondary | ICD-10-CM | POA: Diagnosis not present

## 2024-07-11 DIAGNOSIS — F331 Major depressive disorder, recurrent, moderate: Secondary | ICD-10-CM | POA: Diagnosis not present

## 2024-07-15 DIAGNOSIS — M76899 Other specified enthesopathies of unspecified lower limb, excluding foot: Secondary | ICD-10-CM | POA: Diagnosis not present

## 2024-07-15 DIAGNOSIS — M25561 Pain in right knee: Secondary | ICD-10-CM | POA: Diagnosis not present

## 2024-07-30 ENCOUNTER — Encounter: Payer: Self-pay | Admitting: *Deleted

## 2024-07-31 ENCOUNTER — Encounter (HOSPITAL_BASED_OUTPATIENT_CLINIC_OR_DEPARTMENT_OTHER): Admitting: Cardiovascular Disease

## 2024-07-31 ENCOUNTER — Encounter (HOSPITAL_BASED_OUTPATIENT_CLINIC_OR_DEPARTMENT_OTHER): Payer: Self-pay

## 2024-07-31 DIAGNOSIS — M76899 Other specified enthesopathies of unspecified lower limb, excluding foot: Secondary | ICD-10-CM | POA: Diagnosis not present

## 2024-07-31 DIAGNOSIS — M25561 Pain in right knee: Secondary | ICD-10-CM | POA: Diagnosis not present

## 2024-07-31 NOTE — Progress Notes (Deleted)
 Advanced Hypertension Clinic Follow Up:    Date:  07/31/2024   ID:  Kathy Molina, DOB Jun 22, 1981, MRN [id number]  PCP:  Tanda Stabs, FNP  Cardiologist:  None  Nephrologist:  Referring MD: Tanda Stabs, FNP   CC: Hypertension  History of Present Illness:    Kathy Molina is a 43 y.o. female with a hx of hypertension, hypothyroidism, diabetes, GERD, fibroids, migraines, and depression, here for follow up.  She was first seen to establish care in the Advanced Hypertension Clinic. She saw Dr. Rutherford 05/04/2022 and her blood pressure was 125/90. She was previously intolerant of HCTZ. She was referred to the Advanced Hypertension clinic for second opinion on hypertension management. She was seen by Kathy Hasten, PA-C 12/04/2022 and her blood pressure was 122/84. Her losartan had previously been increased to 100 mg. She felt like this was working along with trying to eliminate salt from her diet, although she was not monitoring home blood pressures.  Kathy Molina was first diagnosed with hypertension in 2023.  At her visit 12/2022 she was on losartan and noted that she struggled with checking her blood pressure at home due to anxiety.  Blood pressure in the office was initially 162/110 and improved to 136/98 on recheck.  She was not getting much exercise.  She was referred to the prep program.  She was preparing for fibroid surgery with the hopes of becoming pregnant soon.  She follow-up with Kathy Finder, NP 04/2023 and her blood pressure was 138/86.  Nifedipine  was added.  Her home blood pressure was averaging in the 130s to 140s.  She noted elevated heart rates.  She was also under a lot of stress at her appointment 11/2023.  Echo revealed LVEF 60-65% with mild LVH and normal diastolic function. She was seen 03/2024 and her BP was slightly above goal.  Nifedipine  was increased.    At her visit 05/2024 Kathy Molina reported an allergic reaction.  This caused a nodular rash.  Blood pressures were  stable but she noted some increased swelling after increasing nifedipine .  Nifedipine  was reduced and she was started on spironolactone .  Discussed the use of AI scribe software for clinical note transcription with the patient, who gave verbal consent to proceed.  History of Present Illness    Previous antihypertensives: HCTZ - pruritus Amlodipine - swelling  Past Medical History:  Diagnosis Date   Depression    Diabetes mellitus without complication (HCC)    borderline   Essential hypertension 02/16/2023   Fibroid uterus    GERD (gastroesophageal reflux disease)    Hypothyroidism    was told she had low thyroid  level, but no follow up since   Migraine    Motion sickness    Paroxysmal tachycardia (HCC) 01/07/2024   Seasonal allergies    Sleep walking     Past Surgical History:  Procedure Laterality Date   MYOMECTOMY N/A 11/30/2015   Procedure: MYOMECTOMY;  Surgeon: Winton Felt, MD;  Location: [facility];  Service: Gynecology;  Laterality: N/A;    Current Medications: No outpatient medications have been marked as taking for the 07/31/24 encounter (Appointment) with Raford Riggs, MD.     Allergies:   Augmentin [amoxicillin-pot clavulanate], Other, Fish oil, and Shellfish allergy   Social History   Socioeconomic History   Marital status: Single    Spouse name: Not on file   Number of children: Not on file   Years of education: Not on file   Highest education level: Not on file  Occupational  History   Not on file  Tobacco Use   Smoking status: Never   Smokeless tobacco: Never  Substance and Sexual Activity   Alcohol use: Yes    Comment: occassionally   Drug use: No   Sexual activity: Yes    Birth control/protection: Injection  Other Topics Concern   Not on file  Social History Narrative   Not on file   Social Drivers of Health   Financial Resource Strain: Low Risk  (02/20/2024)   Received from [facility]   Overall Financial Resource Strain (CARDIA)     Difficulty of Paying Living Expenses: Not hard at all  Food Insecurity: No Food Insecurity (02/20/2024)   Received from [facility]   Hunger Vital Sign    Within the past 12 months, you worried that your food would run out before you got the money to buy more.: Never true    Within the past 12 months, the food you bought just didn't last and you didn't have money to get more.: Never true  Transportation Needs: No Transportation Needs (02/20/2024)   Received from [facility] - Transportation    Lack of Transportation (Medical): No    Lack of Transportation (Non-Medical): No  Physical Activity: Unknown (02/20/2024)   Received from [facility]   Exercise Vital Sign    On average, how many days per week do you engage in moderate to strenuous exercise (like a brisk walk)?: 0 days    Minutes of Exercise per Session: Not on file  Stress: Stress Concern Present (02/20/2024)   Received from [facility] - Occupational Stress Questionnaire    Feeling of Stress : To some extent  Social Connections: Moderately Integrated (02/20/2024)   Received from [facility]   Social Network    How would you rate your social network (family, work, friends)?: Adequate participation with social networks     Family History: The patient's family history includes Heart attack in her maternal aunt; Hypertension in her father and maternal aunt; Stroke in her maternal aunt.  ROS:   Please see the history of present illness.    (+) Snoring (+) Daytime somnolence All other systems reviewed and are negative.  EKGs/Labs/Other Studies Reviewed:    Exercise Stress Test 07/31/2022  ([facility]): Patient exercised for 4 minutes on a standard protocol.  Patient achieved a workload of 7.2 METS, reached 96% of maximal  age-predicted heart rate.  Patient had normal hemodynamic response to exercise.  Baseline normal EKG, mild nonspecific ST changes with exercise.    Echocardiogram  07/31/2022  ([facility]): Impression: Left Ventricle: Left ventricle size is normal.    Left Ventricle: There is moderate concentric hypertrophy.    Left Ventricle: Systolic function is normal. EF: 60-65%.    Right Ventricle: Right ventricle size is normal.    Right Ventricle: Systolic function is normal.    EKG:  EKG is personally reviewed. 01/11/2023: Sinus tachycardia. Rate 103 bpm. LVH.  Recent Labs: No results found for requested labs within last 365 days.   Recent Lipid Panel No results found for: CHOL, TRIG, HDL, CHOLHDL, VLDL, LDLCALC, LDLDIRECT  Physical Exam:    VS:  There were no vitals taken for this visit. , BMI There is no height or weight on file to calculate BMI. GENERAL:  Well appearing HEENT: Pupils equal round and reactive, fundi not visualized, oral mucosa unremarkable NECK:  No jugular venous distention, waveform within normal limits, carotid upstroke brisk  and symmetric, no bruits, no thyromegaly LUNGS:  Clear to auscultation bilaterally HEART: Tachycardic. PMI not displaced or sustained,S1 and S2 within normal limits, no S3, no S4, no clicks, no rubs, no murmurs ABD:  Flat, positive bowel sounds normal in frequency in pitch, no bruits, no rebound, no guarding, no midline pulsatile mass, no hepatomegaly, no splenomegaly EXT:  2 plus pulses throughout, no edema, no cyanosis, no clubbing SKIN:  No rashes, no nodules NEURO:  Cranial nerves II through XII grossly intact, motor grossly intact throughout PSYCH:  Cognitively intact, oriented to person place and time   ASSESSMENT/PLAN:           Screening for Secondary Hypertension:     01/11/2023    3:12 PM  Causes  Drugs/Herbals Screened     - Comments tries to limit salt.  3 teas daily.  No tob.  EtOH rare  Sleep Apnea Screened  Coarctation of the Aorta Screened     - Comments BP symmetric  Compliance Screened    Relevant Labs/Studies:    Latest Ref Rng & Units 05/06/2017     8:31 PM  Basic Labs  Sodium 135 - 145 mmol/L 135   Potassium 3.5 - 5.1 mmol/L 2.8   Creatinine 0.44 - 1.00 mg/dL 8.99      Disposition:    FU with Delynn Olvera C. Raford, MD, [facility] in 3 months.  Virtual is ok.  Medication Adjustments/Labs and Tests Ordered: Current medicines are reviewed at length with the patient today.  Concerns regarding medicines are outlined above.   No orders of the defined types were placed in this encounter.  No orders of the defined types were placed in this encounter.    Signed, Annabella Raford, MD  07/31/2024 12:45 PM    [facility]

## 2024-08-08 DIAGNOSIS — F4323 Adjustment disorder with mixed anxiety and depressed mood: Secondary | ICD-10-CM | POA: Diagnosis not present

## 2024-08-11 DIAGNOSIS — M76899 Other specified enthesopathies of unspecified lower limb, excluding foot: Secondary | ICD-10-CM | POA: Diagnosis not present

## 2024-08-11 DIAGNOSIS — M25561 Pain in right knee: Secondary | ICD-10-CM | POA: Diagnosis not present

## 2024-08-21 DIAGNOSIS — M76899 Other specified enthesopathies of unspecified lower limb, excluding foot: Secondary | ICD-10-CM | POA: Diagnosis not present

## 2024-08-21 DIAGNOSIS — M25561 Pain in right knee: Secondary | ICD-10-CM | POA: Diagnosis not present

## 2024-08-29 DIAGNOSIS — D219 Benign neoplasm of connective and other soft tissue, unspecified: Secondary | ICD-10-CM | POA: Diagnosis not present

## 2024-08-29 DIAGNOSIS — D509 Iron deficiency anemia, unspecified: Secondary | ICD-10-CM | POA: Diagnosis not present

## 2024-08-29 DIAGNOSIS — L739 Follicular disorder, unspecified: Secondary | ICD-10-CM | POA: Diagnosis not present

## 2024-08-29 DIAGNOSIS — I10 Essential (primary) hypertension: Secondary | ICD-10-CM | POA: Diagnosis not present

## 2024-08-29 DIAGNOSIS — N926 Irregular menstruation, unspecified: Secondary | ICD-10-CM | POA: Diagnosis not present

## 2024-09-02 DIAGNOSIS — M25561 Pain in right knee: Secondary | ICD-10-CM | POA: Diagnosis not present

## 2024-09-02 DIAGNOSIS — M76899 Other specified enthesopathies of unspecified lower limb, excluding foot: Secondary | ICD-10-CM | POA: Diagnosis not present

## 2024-09-05 DIAGNOSIS — F411 Generalized anxiety disorder: Secondary | ICD-10-CM | POA: Diagnosis not present

## 2024-09-05 DIAGNOSIS — F331 Major depressive disorder, recurrent, moderate: Secondary | ICD-10-CM | POA: Diagnosis not present

## 2024-09-08 DIAGNOSIS — M76899 Other specified enthesopathies of unspecified lower limb, excluding foot: Secondary | ICD-10-CM | POA: Diagnosis not present

## 2024-09-08 DIAGNOSIS — M25561 Pain in right knee: Secondary | ICD-10-CM | POA: Diagnosis not present

## 2024-09-12 DIAGNOSIS — F4323 Adjustment disorder with mixed anxiety and depressed mood: Secondary | ICD-10-CM | POA: Diagnosis not present

## 2024-09-16 DIAGNOSIS — M25561 Pain in right knee: Secondary | ICD-10-CM | POA: Diagnosis not present

## 2024-09-16 DIAGNOSIS — M76899 Other specified enthesopathies of unspecified lower limb, excluding foot: Secondary | ICD-10-CM | POA: Diagnosis not present

## 2024-09-26 DIAGNOSIS — L281 Prurigo nodularis: Secondary | ICD-10-CM | POA: Diagnosis not present

## 2024-10-20 ENCOUNTER — Encounter (HOSPITAL_BASED_OUTPATIENT_CLINIC_OR_DEPARTMENT_OTHER): Payer: Self-pay | Admitting: Cardiovascular Disease

## 2024-10-20 ENCOUNTER — Ambulatory Visit (HOSPITAL_BASED_OUTPATIENT_CLINIC_OR_DEPARTMENT_OTHER): Admitting: Cardiovascular Disease

## 2024-10-20 VITALS — BP 118/84 | HR 105 | Ht 69.0 in | Wt 275.0 lb

## 2024-10-20 DIAGNOSIS — F4323 Adjustment disorder with mixed anxiety and depressed mood: Secondary | ICD-10-CM | POA: Diagnosis not present

## 2024-10-20 DIAGNOSIS — I479 Paroxysmal tachycardia, unspecified: Secondary | ICD-10-CM

## 2024-10-20 DIAGNOSIS — Z9889 Other specified postprocedural states: Secondary | ICD-10-CM | POA: Diagnosis not present

## 2024-10-20 DIAGNOSIS — I10 Essential (primary) hypertension: Secondary | ICD-10-CM

## 2024-10-20 NOTE — Progress Notes (Signed)
 Advanced Hypertension Clinic Follow Up   Date:  10/20/2024   ID:  Kathy Molina, DOB 06-20-1981, MRN [id number]  PCP:  Tanda Stabs, FNP  Cardiologist:  None  Electrophysiologist:  None   Chief Complaint:  hypertension  History of Present Illness:    Kathy Molina is a 43 y.o. female with a hx of hypertension, hypothyroidism, diabetes, GERD, fibroids, migraines, and depression, here for follow up.  She was first seen to establish care in the Advanced Hypertension Clinic. She saw Dr. Rutherford 05/04/2022 and her blood pressure was 125/90. She was previously intolerant of HCTZ. She was referred to the Advanced Hypertension clinic for second opinion on hypertension management. She was seen by Norleen Hasten, PA-C 12/04/2022 and her blood pressure was 122/84. Her losartan had previously been increased to 100 mg. She felt like this was working along with trying to eliminate salt from her diet, although she was not monitoring home blood pressures.  [name] was first diagnosed with hypertension in 2023.  At her visit 12/2022 she was on losartan and noted that she struggled with checking her blood pressure at home due to anxiety.  Blood pressure in the office was initially 162/110 and improved to 136/98 on recheck.  She was not getting much exercise.  She was referred to the prep program.  She was preparing for fibroid surgery with the hopes of becoming pregnant soon.  She follow-up with [name], NP 04/2023 and her blood pressure was 138/86.  Nifedipine  was added.  Her home blood pressure was averaging in the 130s to 140s.    She has noted elevated heart rates.  She was also under a lot of stress at her appointment 11/2023.  Echo revealed LVEF 60-65% with mild LVH and normal diastolic function. She was seen 03/2024 and her BP was slightly above goal.  Nifedipine  was increased.   At her visit 05/2024 she noted leg swelling so nifedipine  was switched to spironolactone .  Discussed the use of AI scribe  software for clinical note transcription with the patient, who gave verbal consent to proceed.  History of Present Illness [name] notes that her blood pressure was previously elevated, leading to an increase in her medication dose. She attributes the high blood pressure to allergic reactions to certain foods and environmental factors. She has since identified allergies to various fruits, vegetables, and trees, which she was consuming regularly.  She acknowledges a lack of physical activity, attributing it to a lack of motivation and her allergies, which prevent her from exercising outdoors. She has access to a gym in her apartment complex but has not utilized it. A friend from college has recently reached out to her and wants to start exercising together.  No chest pain, pressure, or breathing difficulties during physical activities. She mentions attending physical therapy for a knee injury from a fall, which has resolved without issues.  Her heart rate tends to be elevated during medical visits due to nervousness, but she does not experience this in her daily life. She notes that work has been stressful but manageable.   Previous antihypertensives: HCTZ - pruritus Amlodipine - swelling  Past Medical History:  Diagnosis Date   Depression    Diabetes mellitus without complication (HCC)    borderline   Essential hypertension 02/16/2023   Fibroid uterus    GERD (gastroesophageal reflux disease)    Hypothyroidism    was told she had low thyroid  level, but no follow up since   Migraine    Motion  sickness    Paroxysmal tachycardia (HCC) 01/07/2024   Seasonal allergies    Sleep walking     Past Surgical History:  Procedure Laterality Date   MYOMECTOMY N/A 11/30/2015   Procedure: MYOMECTOMY;  Surgeon: Winton Felt, MD;  Location: [facility];  Service: Gynecology;  Laterality: N/A;    Current Medications: Current Meds  Medication Sig   buPROPion (WELLBUTRIN SR) 200 MG 12 hr tablet  Take 200 mg by mouth 2 (two) times daily.   cetirizine (ZYRTEC) 10 MG tablet Take 10 mg by mouth 2 (two) times daily.   cyclobenzaprine (FLEXERIL) 10 MG tablet Take 10 mg by mouth 3 (three) times daily as needed.   diphenhydrAMINE  (SOMINEX) 25 MG tablet Take 25 mg by mouth as needed for allergies. Reported on 01/14/2016   DULoxetine (CYMBALTA) 30 MG capsule Take 90 mg by mouth daily.   EPINEPHrine (EPIPEN 2-PAK) 0.3 mg/0.3 mL IJ SOAJ injection Inject 0.3 mg into the muscle as needed.   famotidine (PEPCID) 20 MG tablet Take 20 mg by mouth 2 (two) times daily.   ferrous sulfate 325 (65 FE) MG tablet Take 325 mg by mouth daily.   fluticasone (FLONASE) 50 MCG/ACT nasal spray Place 2 sprays into both nostrils as needed for allergies.   hydrOXYzine (VISTARIL) 25 MG capsule Take 25 mg by mouth as needed for itching.   ibuprofen  (ADVIL ) 800 MG tablet Take 800 mg by mouth 3 (three) times daily. (Patient taking differently: Take 800 mg by mouth every 8 (eight) hours as needed.)   nemolizumab-ilto (NEMLUVIO) 30 MG SQ injection Inject 30 mg into the skin. Every 4 weeks   NIFEdipine  (ADALAT  CC) 30 MG 24 hr tablet Take 1 tablet (30 mg total) by mouth daily.   omeprazole (PRILOSEC) 10 MG capsule Take 10 mg by mouth daily.   spironolactone  (ALDACTONE ) 25 MG tablet Take 1 tablet (25 mg total) by mouth daily.     Allergies:   Ambrosia artemisiifolia (ragweed) skin test, Amoxicillin, Clavulanic acid, Hydrochlorothiazide, Molds & smuts, Sodium lactate, Sulfa antibiotics, Tree extract, Augmentin [amoxicillin-pot clavulanate], Corylus, Other, Peanut-containing drug products, Fish oil, and Shellfish allergy   Social History   Socioeconomic History   Marital status: Single    Spouse name: Not on file   Number of children: Not on file   Years of education: Not on file   Highest education level: Not on file  Occupational History   Not on file  Tobacco Use   Smoking status: Never    Passive exposure: Never    Smokeless tobacco: Never  Substance and Sexual Activity   Alcohol use: Yes    Comment: occassionally   Drug use: No   Sexual activity: Yes    Birth control/protection: Injection  Other Topics Concern   Not on file  Social History Narrative   Not on file   Social Drivers of Health   Financial Resource Strain: Low Risk  (02/20/2024)   Received from [facility]   Overall Financial Resource Strain (CARDIA)    Difficulty of Paying Living Expenses: Not hard at all  Food Insecurity: No Food Insecurity (02/20/2024)   Received from [facility]   Hunger Vital Sign    Within the past 12 months, you worried that your food would run out before you got the money to buy more.: Never true    Within the past 12 months, the food you bought just didn't last and you didn't have money to get more.: Never true  Transportation Needs: No  Transportation Needs (02/20/2024)   Received from [facility]   PRAPARE - Transportation    Lack of Transportation (Medical): No    Lack of Transportation (Non-Medical): No  Physical Activity: Unknown (02/20/2024)   Received from [facility]   Exercise Vital Sign    On average, how many days per week do you engage in moderate to strenuous exercise (like a brisk walk)?: 0 days    Minutes of Exercise per Session: Not on file  Stress: Stress Concern Present (02/20/2024)   Received from [facility] of [facility]ational [facility] - Occupational Stress Questionnaire    Feeling of Stress : To some extent  Social Connections: Moderately Integrated (02/20/2024)   Received from [facility]   Social Network    How would you rate your social network (family, work, friends)?: Adequate participation with social networks     Family History: The patient's family history includes Heart attack in her maternal aunt and paternal grandfather; Hypertension in her father and maternal aunt; Stroke in her maternal aunt.  ROS:   Please see the history of present illness.     (+) Snoring (+) Daytime somnolence All other systems reviewed and are negative.  EKGs/Labs/Other Studies Reviewed:    Exercise Stress Test 07/31/2022  ([facility]): Patient exercised for 4 minutes on a standard protocol.  Patient achieved a workload of 7.2 METS, reached 96% of maximal  age-predicted heart rate.  Patient had normal hemodynamic response to exercise.  Baseline normal EKG, mild nonspecific ST changes with exercise.   Echocardiogram  07/31/2022  ([facility]): Impression: Left Ventricle: Left ventricle size is normal.    Left Ventricle: There is moderate concentric hypertrophy.    Left Ventricle: Systolic function is normal. EF: 60-65%.    Right Ventricle: Right ventricle size is normal.    Right Ventricle: Systolic function is normal.    EKG:  EKG is personally reviewed. 01/11/2023: Sinus tachycardia. Rate 103 bpm. LVH.  Recent Labs: No results found for requested labs within last 365 days.   Recent Lipid Panel No results found for: CHOL, TRIG, HDL, CHOLHDL, VLDL, LDLCALC, LDLDIRECT  Physical Exam:    BP 118/84 (BP Location: Left Arm, Patient Position: Sitting, Cuff Size: Large)   Pulse (!) 105   Ht 5' 9 (1.753 m)   Wt 275 lb (124.7 kg)   SpO2 96%   BMI 40.61 kg/m  GENERAL: Well-appearing.  No acute distress. HEENT: Pupils equal round.  Oral mucosa unremarkable NECK:  No jugular venous distention, no visible thyromegaly EXT:  No edema, no cyanosis no clubbing SKIN:  No rashes no nodules NEURO:  Speech fluent.  Cranial nerves grossly intact.  Moves all 4 extremities freely PSYCH:  Cognitively intact, oriented to person place and time    ASSESSMENT/PLAN:    Assessment & Plan # Essential hypertension Blood pressure controlled with nifedipine  and spironolactone . Previous dose increase due to elevated readings from allergies. Current readings satisfactory.  Mostly <130/80.  - Continue nifedipine  and spironolactone . - Encourage increased physical  activity.  # Paroxysmal tachycardia Mildly elevated heart rate likely due to appointment anxiety. No symptoms outside clinic.   She has her heart rate is frequently elevated when at doctors appointments.  # Prediabetes A1c in prediabetes range. Exercise and dietary changes recommended to manage blood sugar. - Encouraged regular physical activity. - Advised reducing processed carbohydrates   Screening for Secondary Hypertension:     01/11/2023    3:12 PM  Causes  Drugs/Herbals Screened     -  Comments tries to limit salt.  3 teas daily.  No tob.  EtOH rare  Sleep Apnea Screened  Coarctation of the Aorta Screened     - Comments BP symmetric  Compliance Screened    Relevant Labs/Studies:    Latest Ref Rng & Units 05/06/2017    8:31 PM  Basic Labs  Sodium 135 - 145 mmol/L 135   Potassium 3.5 - 5.1 mmol/L 2.8   Creatinine 0.44 - 1.00 mg/dL 8.99      Disposition:    FU with Damontae Loppnow C. Raford, MD, [facility] in 1 year  Medication Adjustments/Labs and Tests Ordered: Current medicines are reviewed at length with the patient today.  Concerns regarding medicines are outlined above.   Orders Placed This Encounter  Procedures   EKG 12-Lead   No orders of the defined types were placed in this encounter.   Signed, Annabella Raford, MD  10/20/2024 5:34 PM    [facility]

## 2024-10-20 NOTE — Patient Instructions (Signed)
 Medication Instructions:  Your physician recommends that you continue on your current medications as directed. Please refer to the Current Medication list given to you today.   Labwork: NONE  Testing/Procedures: NONE  Follow-Up: 1 YEAR WITH DR [place] OR CAITLIN W NP   If you need a refill on your cardiac medications before your next appointment, please call your pharmacy.

## 2024-10-21 DIAGNOSIS — Z1231 Encounter for screening mammogram for malignant neoplasm of breast: Secondary | ICD-10-CM | POA: Diagnosis not present

## 2024-10-31 DIAGNOSIS — R928 Other abnormal and inconclusive findings on diagnostic imaging of breast: Secondary | ICD-10-CM | POA: Diagnosis not present

## 2024-11-19 DIAGNOSIS — J069 Acute upper respiratory infection, unspecified: Secondary | ICD-10-CM | POA: Diagnosis not present

## 2024-11-20 ENCOUNTER — Other Ambulatory Visit (HOSPITAL_BASED_OUTPATIENT_CLINIC_OR_DEPARTMENT_OTHER): Payer: Self-pay | Admitting: Cardiovascular Disease

## 2024-11-20 DIAGNOSIS — I10 Essential (primary) hypertension: Secondary | ICD-10-CM
